# Patient Record
Sex: Female | Born: 1980 | Race: White | Hispanic: No | Smoking: Never smoker
Health system: Southern US, Community
[De-identification: ages and names within clinical notes are randomized; demographics above are authoritative.]

## PROBLEM LIST (undated history)

## (undated) HISTORY — PX: ABDOMINAL HYSTERECTOMY: SHX81

---

## 2006-03-03 ENCOUNTER — Emergency Department: Payer: Self-pay | Admitting: Internal Medicine

## 2006-03-13 ENCOUNTER — Other Ambulatory Visit: Payer: Self-pay

## 2006-03-13 ENCOUNTER — Emergency Department: Payer: Self-pay | Admitting: Emergency Medicine

## 2006-04-23 ENCOUNTER — Ambulatory Visit: Payer: Self-pay | Admitting: Gastroenterology

## 2006-09-09 ENCOUNTER — Observation Stay: Payer: Self-pay | Admitting: Obstetrics and Gynecology

## 2006-10-16 ENCOUNTER — Observation Stay: Payer: Self-pay | Admitting: Obstetrics and Gynecology

## 2006-11-22 ENCOUNTER — Observation Stay: Payer: Self-pay | Admitting: Obstetrics and Gynecology

## 2006-12-07 ENCOUNTER — Other Ambulatory Visit: Payer: Self-pay

## 2006-12-07 ENCOUNTER — Emergency Department: Payer: Self-pay | Admitting: Emergency Medicine

## 2006-12-07 ENCOUNTER — Observation Stay: Payer: Self-pay

## 2006-12-21 ENCOUNTER — Observation Stay: Payer: Self-pay | Admitting: Obstetrics and Gynecology

## 2006-12-23 ENCOUNTER — Ambulatory Visit: Payer: Self-pay | Admitting: Obstetrics and Gynecology

## 2006-12-24 ENCOUNTER — Inpatient Hospital Stay: Payer: Self-pay | Admitting: Obstetrics and Gynecology

## 2010-06-01 ENCOUNTER — Observation Stay: Payer: Self-pay | Admitting: Obstetrics and Gynecology

## 2010-06-22 ENCOUNTER — Inpatient Hospital Stay: Payer: Self-pay

## 2010-07-21 ENCOUNTER — Emergency Department: Payer: Self-pay | Admitting: Internal Medicine

## 2016-05-08 ENCOUNTER — Other Ambulatory Visit: Payer: Self-pay | Admitting: Medical Oncology

## 2016-05-08 ENCOUNTER — Ambulatory Visit: Admission: RE | Admit: 2016-05-08 | Payer: 59 | Source: Ambulatory Visit

## 2016-05-08 DIAGNOSIS — R1031 Right lower quadrant pain: Secondary | ICD-10-CM

## 2017-03-18 ENCOUNTER — Other Ambulatory Visit: Payer: Self-pay | Admitting: Family Medicine

## 2017-06-19 ENCOUNTER — Other Ambulatory Visit: Payer: Self-pay | Admitting: Pediatrics

## 2017-06-19 DIAGNOSIS — K5900 Constipation, unspecified: Secondary | ICD-10-CM

## 2017-06-19 DIAGNOSIS — R140 Abdominal distension (gaseous): Secondary | ICD-10-CM

## 2017-06-20 ENCOUNTER — Ambulatory Visit
Admission: RE | Admit: 2017-06-20 | Discharge: 2017-06-20 | Disposition: A | Discharge status: Home / Self Care | Payer: Medicaid Other | Source: Ambulatory Visit | Attending: Pediatrics | Admitting: Pediatrics

## 2017-06-20 DIAGNOSIS — R140 Abdominal distension (gaseous): Secondary | ICD-10-CM | POA: Diagnosis not present

## 2017-06-20 DIAGNOSIS — Z90721 Acquired absence of ovaries, unilateral: Secondary | ICD-10-CM | POA: Diagnosis not present

## 2017-06-20 DIAGNOSIS — K5900 Constipation, unspecified: Secondary | ICD-10-CM | POA: Insufficient documentation

## 2017-06-20 DIAGNOSIS — Z90710 Acquired absence of both cervix and uterus: Secondary | ICD-10-CM | POA: Diagnosis not present

## 2017-06-26 ENCOUNTER — Other Ambulatory Visit: Payer: Self-pay | Admitting: Obstetrics and Gynecology

## 2017-06-26 DIAGNOSIS — R140 Abdominal distension (gaseous): Secondary | ICD-10-CM

## 2017-06-26 DIAGNOSIS — Z90710 Acquired absence of both cervix and uterus: Secondary | ICD-10-CM

## 2017-07-01 ENCOUNTER — Other Ambulatory Visit: Payer: Self-pay | Admitting: Gastroenterology

## 2017-07-01 DIAGNOSIS — R1084 Generalized abdominal pain: Secondary | ICD-10-CM

## 2017-07-04 ENCOUNTER — Ambulatory Visit
Admission: RE | Admit: 2017-07-04 | Discharge: 2017-07-04 | Disposition: A | Discharge status: Home / Self Care | Payer: Medicaid Other | Source: Ambulatory Visit | Attending: Gastroenterology | Admitting: Gastroenterology

## 2017-07-04 DIAGNOSIS — R1084 Generalized abdominal pain: Secondary | ICD-10-CM

## 2017-07-04 DIAGNOSIS — N200 Calculus of kidney: Secondary | ICD-10-CM | POA: Insufficient documentation

## 2017-07-04 MED ORDER — IOPAMIDOL (ISOVUE-370) INJECTION 76%
100.0000 mL | Freq: Once | INTRAVENOUS | Status: AC | PRN
  Administered 2017-07-04: 100 mL via INTRAVENOUS

## 2017-07-07 ENCOUNTER — Ambulatory Visit: Payer: Medicaid Other

## 2018-12-24 ENCOUNTER — Other Ambulatory Visit: Payer: Self-pay | Admitting: Pediatrics

## 2018-12-24 ENCOUNTER — Other Ambulatory Visit: Payer: Self-pay | Admitting: Medical Oncology

## 2018-12-24 DIAGNOSIS — Z1231 Encounter for screening mammogram for malignant neoplasm of breast: Secondary | ICD-10-CM

## 2018-12-30 ENCOUNTER — Inpatient Hospital Stay: Admission: RE | Admit: 2018-12-30 | Payer: Medicaid Other | Source: Ambulatory Visit

## 2019-01-13 ENCOUNTER — Other Ambulatory Visit: Payer: Self-pay

## 2019-01-13 DIAGNOSIS — Z20822 Contact with and (suspected) exposure to covid-19: Secondary | ICD-10-CM

## 2019-01-15 LAB — NOVEL CORONAVIRUS, NAA: SARS-CoV-2, NAA: NOT DETECTED

## 2019-03-15 ENCOUNTER — Inpatient Hospital Stay: Admission: RE | Admit: 2019-03-15 | Payer: Medicaid Other | Source: Ambulatory Visit

## 2019-05-08 ENCOUNTER — Ambulatory Visit: Payer: Medicaid Other | Attending: Internal Medicine

## 2019-05-08 DIAGNOSIS — Z23 Encounter for immunization: Secondary | ICD-10-CM

## 2019-05-08 NOTE — Progress Notes (Signed)
   Covid-19 Vaccination Clinic  Name:  Alicia Franklin    MRN: [id number] DOB: June 15, 1980  05/08/2019  [name] was observed post Covid-19 immunization for 15 minutes without incident. She was provided with Vaccine Information Sheet and instruction to access the V-Safe system.   [name] was instructed to call 911 with any severe reactions post vaccine: Marland Kitchen Difficulty breathing  . Swelling of face and throat  . A fast heartbeat  . A bad rash all over body  . Dizziness and weakness   Immunizations Administered    Name Date Dose VIS Date Route   Pfizer COVID-19 Vaccine 05/08/2019  8:56 AM 0.3 mL 02/05/2019 Intramuscular   Manufacturer: [employer], [employer]   Lot: [id number]   NDC: [address]-4

## 2019-05-27 IMAGING — CT CT ABD-PELV W/ CM
1 of 2 series · 15 of 32 positions shown, 19 images · IV contrast (APPLIED)
Comparison: None.

CLINICAL DATA: Diffuse abdominal pain. Bloating and fullness for 2
months after eating

EXAM:
CT ABDOMEN AND PELVIS WITH CONTRAST
TECHNIQUE: Multidetector CT imaging of the abdomen and pelvis was performed
using the standard protocol following bolus administration of
intravenous contrast.
CONTRAST:  100mL [id number] IOPAMIDOL ([id number]-[id number]) INJECTION 76%

[Series 2: axial st · axial · 0.64mm/px · z∈[-674,-258]mm · 15 of 91 slices shown, 19 images]
[im 4/91  soft-tissue]
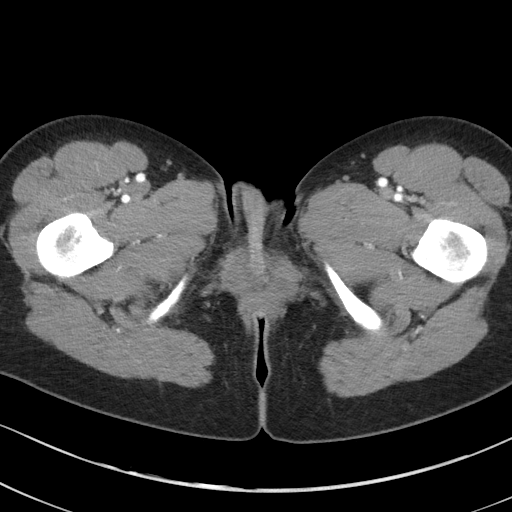
[im 4/91  bone]
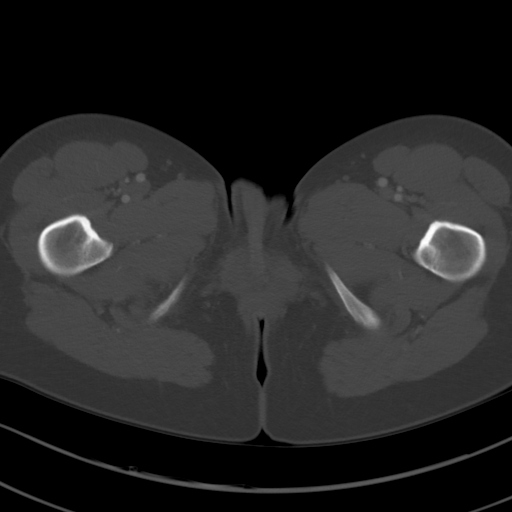
[im 12/91  soft-tissue]
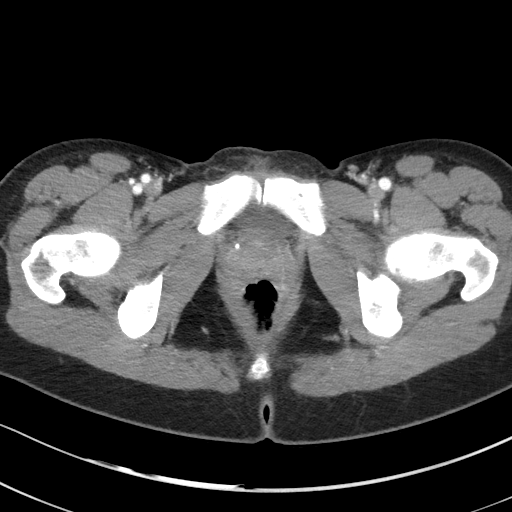
[im 19/91  soft-tissue]
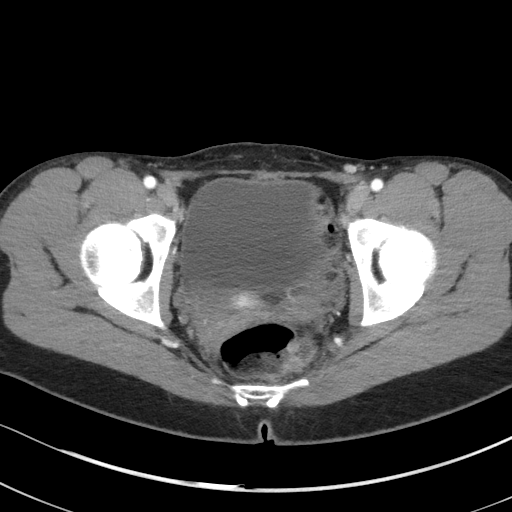
[im 27/91  soft-tissue]
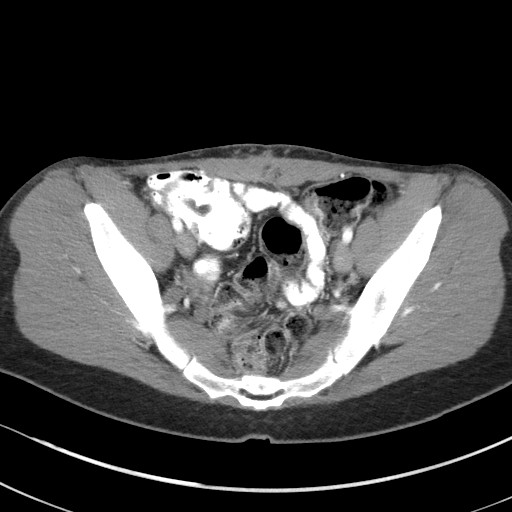
[im 31/91  soft-tissue]
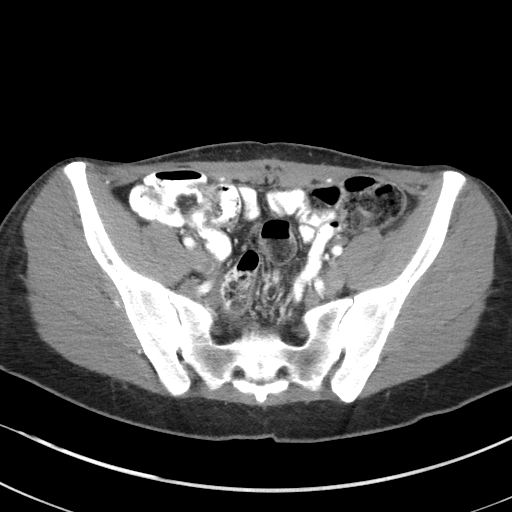
[im 38/91  soft-tissue]
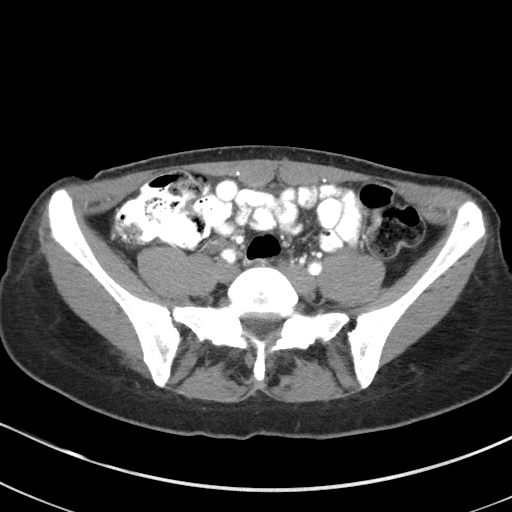
[im 46/91  soft-tissue]
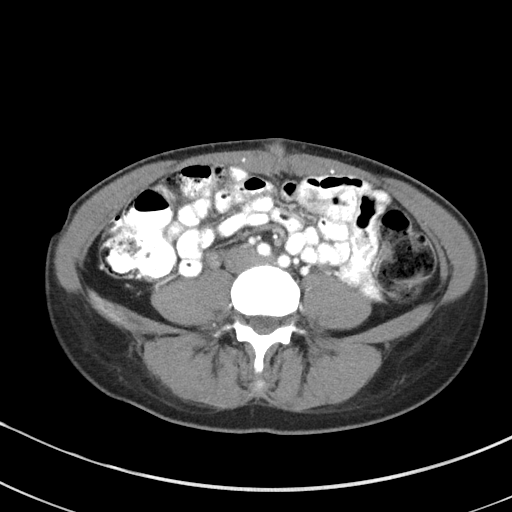
[im 53/91  soft-tissue]
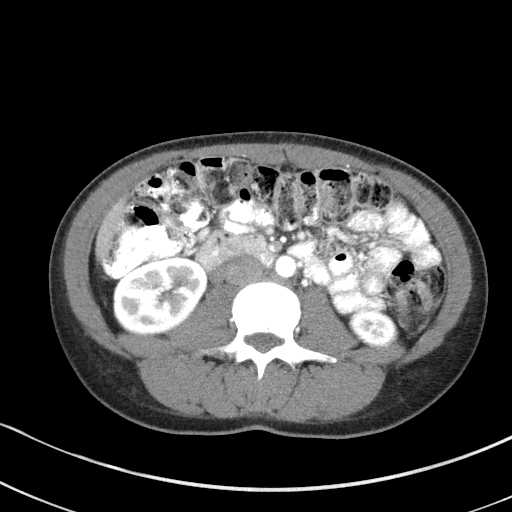
[im 61/91  soft-tissue]
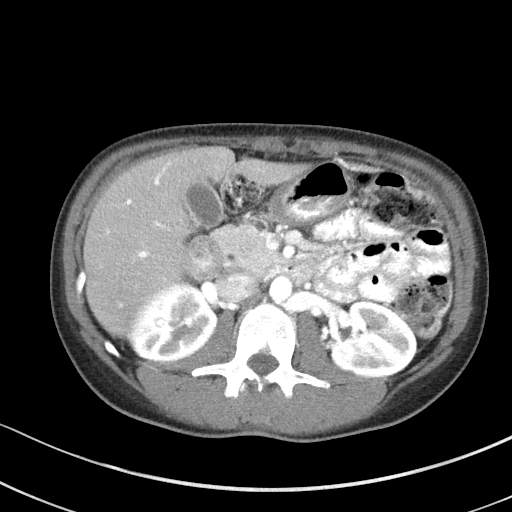
[im 61/91  bone]
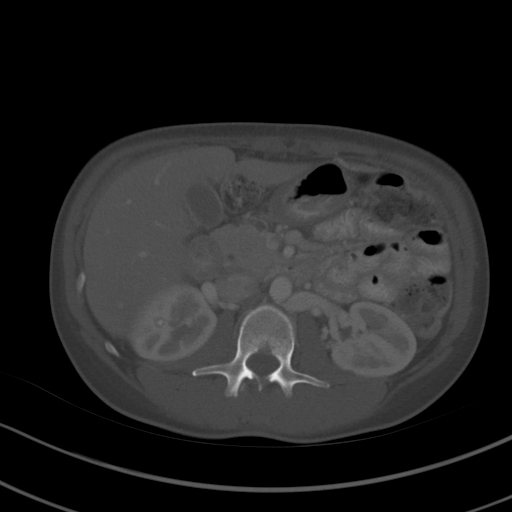
[im 64/91  soft-tissue]
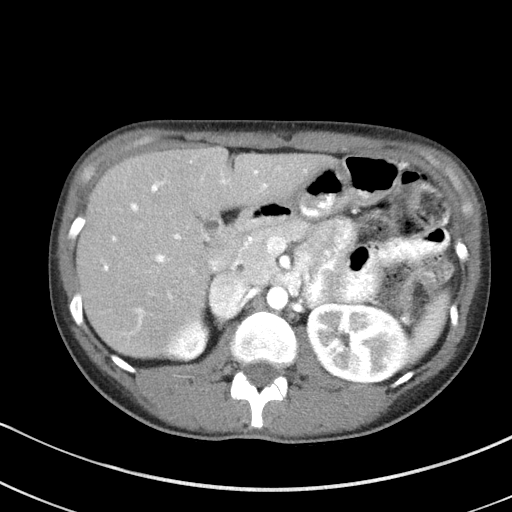
[im 72/91  soft-tissue]
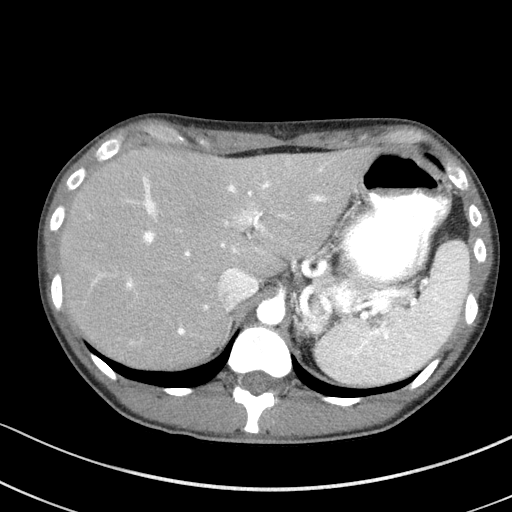
[im 76/91  lung]
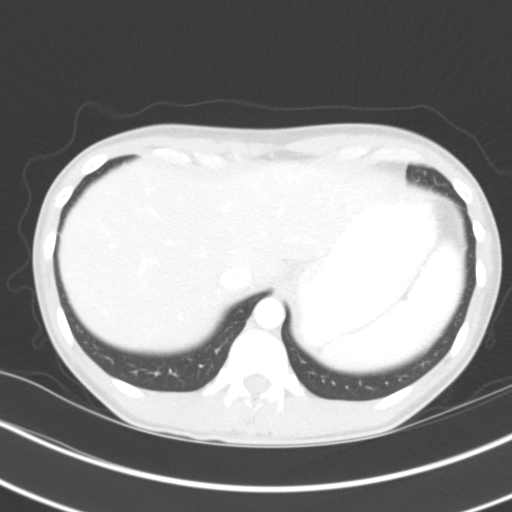
[im 79/91  soft-tissue]
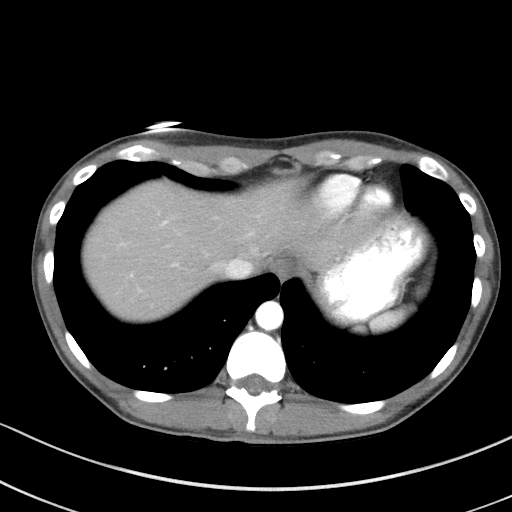
[im 79/91  lung]
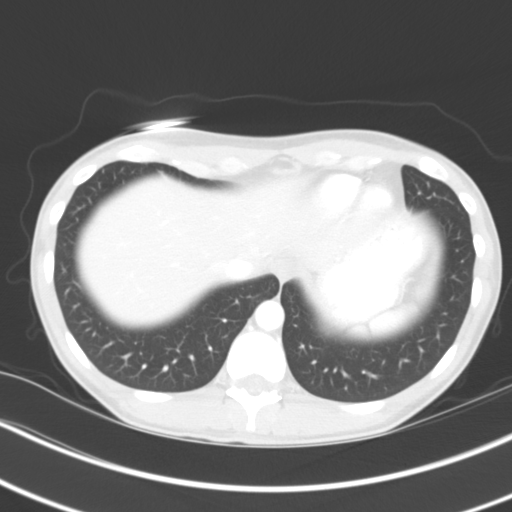
[im 83/91  lung]
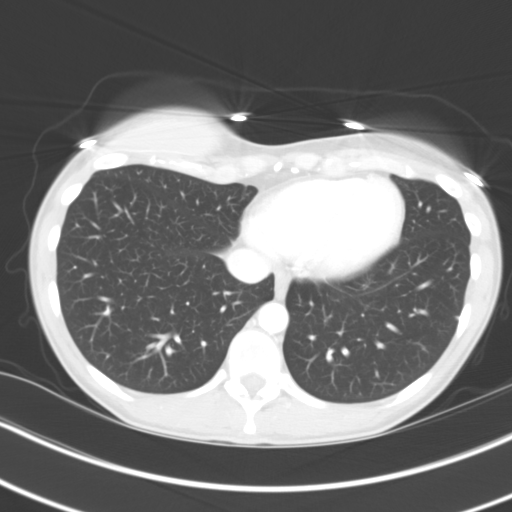
[im 87/91  soft-tissue]
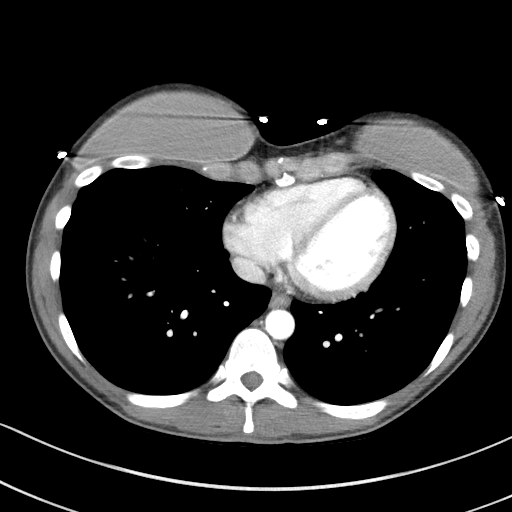
[im 87/91  lung]
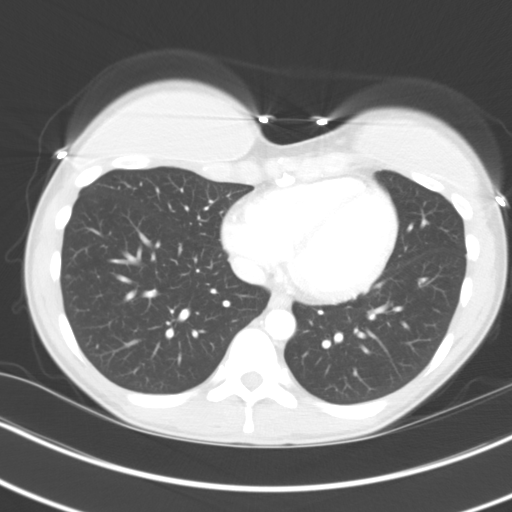

[15 of 32 positions shown; findings below may reference images not displayed]

FINDINGS: Lower chest:  Breast implants that are unremarkable where covered.

Hepatobiliary: 6 mm low-density in the central right liver and 5 mm
low-density below the right diaphragm are too small for
densitometry. An area of hypoperfusion in the left lobe of the liver
in 2225 is only subtly seen today, suspect this was mainly perfusion
anomaly with possible small underlying hemangioma.No evidence of
biliary obstruction or stone.

Pancreas: Unremarkable.

Spleen: Unremarkable.

Adrenals/Urinary Tract: Negative adrenals. Two right renal
calcifications measuring up to 4 mm at the upper pole. Two small
right interpolar cysts that appear simple, measuring up to 8 mm.
Unremarkable bladder.

Stomach/Bowel: No obstruction or inflammation is seen. Oral contrast
reaches the hepatic flexure. Stool seen throughout the colon without
rectal impaction. No appendicitis.

Vascular/Lymphatic: No significant vascular abnormality. Retroaortic
left renal vein. No mass or adenopathy.

Reproductive:Hysterectomy.  Dominant follicle on the right.

Other: No ascites or pneumoperitoneum.

Musculoskeletal: Negative
IMPRESSION: 1. No specific explanation for symptoms.
2. Formed stool is seen throughout the colon.
3. At least 2 right renal calculi measuring up to 4 mm.

## 2019-06-01 ENCOUNTER — Ambulatory Visit: Payer: Medicaid Other

## 2019-06-08 ENCOUNTER — Ambulatory Visit: Payer: Medicaid Other

## 2019-07-24 ENCOUNTER — Other Ambulatory Visit: Payer: Self-pay

## 2019-07-24 ENCOUNTER — Ambulatory Visit: Payer: Medicaid Other | Attending: Internal Medicine

## 2019-07-24 DIAGNOSIS — Z23 Encounter for immunization: Secondary | ICD-10-CM

## 2019-07-24 NOTE — Progress Notes (Signed)
   Covid-19 Vaccination Clinic  Name:  Alicia Franklin    MRN: [id number] DOB: 28-Mar-1980  07/24/2019  [name] was observed post Covid-19 immunization for 15 minutes without incident. She was provided with Vaccine Information Sheet and instruction to access the V-Safe system.   [name] was instructed to call 911 with any severe reactions post vaccine: Marland Kitchen Difficulty breathing  . Swelling of face and throat  . A fast heartbeat  . A bad rash all over body  . Dizziness and weakness   Immunizations Administered    Name Date Dose VIS Date Route   Pfizer COVID-19 Vaccine 07/24/2019 11:48 AM 0.3 mL 04/21/2018 Intramuscular   Manufacturer: [employer], [employer]   Lot: [id number]   NDC: [address]-0

## 2019-07-24 NOTE — Progress Notes (Signed)
   Covid-19 Vaccination Clinic  Name:  Alicia Franklin    MRN: [id number] DOB: 1980-06-30  07/24/2019  Alicia Franklin was observed post Covid-19 immunization for 15 minutes without incident. She was provided with Vaccine Information Sheet and instruction to access the V-Safe system. Patient reports that she is delayed in getting 2nd vaccine due to getting Covid after receiving the 1 st vaccine. She saw her PCP this week who advised her to proceed with getting the 2 nd vaccine. She received her 1 st dose on 05/08/19.  Alicia Franklin was instructed to call 911 with any severe reactions post vaccine: Marland Kitchen Difficulty breathing  . Swelling of face and throat  . A fast heartbeat  . A bad rash all over body  . Dizziness and weakness   Immunizations Administered    Name Date Dose VIS Date Route   Pfizer COVID-19 Vaccine 07/24/2019 11:48 AM 0.3 mL 04/21/2018 Intramuscular   Manufacturer: [employer], [employer]   Lot: [id number]   NDC: [address]-3

## 2019-08-17 ENCOUNTER — Other Ambulatory Visit: Payer: Self-pay | Admitting: Gastroenterology

## 2019-08-17 DIAGNOSIS — R140 Abdominal distension (gaseous): Secondary | ICD-10-CM

## 2019-08-17 DIAGNOSIS — R1084 Generalized abdominal pain: Secondary | ICD-10-CM

## 2019-08-24 ENCOUNTER — Ambulatory Visit: Payer: Medicaid Other

## 2019-08-26 ENCOUNTER — Ambulatory Visit
Admission: RE | Admit: 2019-08-26 | Discharge: 2019-08-26 | Disposition: A | Discharge status: Home / Self Care | Payer: BC Managed Care – PPO | Source: Ambulatory Visit | Attending: Gastroenterology | Admitting: Gastroenterology

## 2019-08-26 ENCOUNTER — Other Ambulatory Visit: Payer: Self-pay

## 2019-08-26 DIAGNOSIS — R140 Abdominal distension (gaseous): Secondary | ICD-10-CM | POA: Diagnosis present

## 2019-08-26 DIAGNOSIS — R1084 Generalized abdominal pain: Secondary | ICD-10-CM

## 2019-08-26 MED ORDER — IOHEXOL 300 MG/ML  SOLN
100.0000 mL | Freq: Once | INTRAMUSCULAR | Status: AC | PRN
  Administered 2019-08-26: 100 mL via INTRAVENOUS

## 2019-11-08 ENCOUNTER — Ambulatory Visit
Admission: EM | Admit: 2019-11-08 | Discharge: 2019-11-08 | Disposition: A | Discharge status: Home / Self Care | Payer: Medicaid Other | Attending: Urgent Care | Admitting: Urgent Care

## 2019-11-08 DIAGNOSIS — J069 Acute upper respiratory infection, unspecified: Secondary | ICD-10-CM | POA: Diagnosis present

## 2019-11-08 DIAGNOSIS — R070 Pain in throat: Secondary | ICD-10-CM | POA: Diagnosis present

## 2019-11-08 DIAGNOSIS — R0981 Nasal congestion: Secondary | ICD-10-CM | POA: Insufficient documentation

## 2019-11-08 LAB — POCT RAPID STREP A (OFFICE): Rapid Strep A Screen: NEGATIVE

## 2019-11-08 MED ORDER — CETIRIZINE HCL 10 MG PO TABS: 10.0000 mg | ORAL_TABLET | Freq: Every day | ORAL | 0 refills | Status: DC

## 2019-11-08 MED ORDER — PSEUDOEPHEDRINE HCL 30 MG PO TABS: 30.0000 mg | ORAL_TABLET | Freq: Three times a day (TID) | ORAL | 0 refills | Status: DC | PRN

## 2019-11-08 NOTE — Discharge Instructions (Signed)

## 2019-11-08 NOTE — ED Provider Notes (Signed)
  Vivien Rossetti   MRN: [id number] DOB: 06-25-1980  Subjective:   Alicia Franklin is a 39 y.o. female presenting for 3-day history acute onset sinus congestion, throat pain, general malaise.  Patient is fully vaccinated.  She is required at her employer/school to be tested for COVID-19.  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  No current facility-administered medications for this encounter. No current outpatient medications on file.   No Known Allergies  History reviewed. No pertinent past medical history.   Past Surgical History:  Procedure Laterality Date  . ABDOMINAL HYSTERECTOMY      History reviewed. No pertinent family history.  Social History   Tobacco Use  . Smoking status: Never Smoker  . Smokeless tobacco: Never Used  Vaping Use  . Vaping Use: Never used  Substance Use Topics  . Alcohol use: Not Currently  . Drug use: Not on file    ROS   Objective:   Vitals: BP 115/76   Pulse 62   Temp 98.9 F (37.2 C)   Resp 16   SpO2 94%   Physical Exam Constitutional:      General: She is not in acute distress.    Appearance: Normal appearance. She is well-developed. She is not ill-appearing, toxic-appearing or diaphoretic.  HENT:     Head: Normocephalic and atraumatic.     Nose: Nose normal.     Mouth/Throat:     Mouth: Mucous membranes are moist.     Pharynx: No pharyngeal swelling, oropharyngeal exudate, posterior oropharyngeal erythema or uvula swelling.     Comments: Postnasal drainage overlying pharynx. Eyes:     Extraocular Movements: Extraocular movements intact.     Pupils: Pupils are equal, round, and reactive to light.  Cardiovascular:     Rate and Rhythm: Normal rate and regular rhythm.     Pulses: Normal pulses.     Heart sounds: Normal heart sounds. No murmur heard.  No friction rub. No gallop.   Pulmonary:     Effort: Pulmonary effort is normal. No respiratory distress.      Breath sounds: Normal breath sounds. No stridor. No wheezing, rhonchi or rales.  Skin:    General: Skin is warm and dry.     Findings: No rash.  Neurological:     Mental Status: She is alert and oriented to person, place, and time.  Psychiatric:        Mood and Affect: Mood normal.        Behavior: Behavior normal.        Thought Content: Thought content normal.     Rapid strep negative on visual inspection.  Assessment and Plan :   PDMP not reviewed this encounter.  1. Viral URI   2. Throat pain   3. Sinus congestion     Will manage for viral illness such as viral URI, viral syndrome, viral rhinitis, COVID-19. Counseled patient on nature of COVID-19 including modes of transmission, diagnostic testing, management and supportive care.  Offered scripts for symptomatic relief. COVID 19 testing is pending. Counseled patient on potential for adverse effects with medications prescribed/recommended today, ER and return-to-clinic precautions discussed, patient verbalized understanding.     Wallis Bamberg, PA-C 11/08/19 1240

## 2019-11-08 NOTE — ED Triage Notes (Signed)
Patient complains of sore throat since Saturday and fever since early this morning. Denies other symptoms. 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

## 2019-11-10 LAB — SARS-COV-2, NAA 2 DAY TAT

## 2019-11-10 LAB — NOVEL CORONAVIRUS, NAA: SARS-CoV-2, NAA: NOT DETECTED

## 2019-11-11 LAB — CULTURE, GROUP A STREP (THRC)

## 2020-02-26 ENCOUNTER — Ambulatory Visit
Admission: EM | Admit: 2020-02-26 | Discharge: 2020-02-26 | Disposition: A | Discharge status: Home / Self Care | Payer: BC Managed Care – PPO | Attending: Family Medicine | Admitting: Family Medicine

## 2020-02-26 ENCOUNTER — Other Ambulatory Visit: Payer: Self-pay

## 2020-02-26 DIAGNOSIS — U071 COVID-19: Secondary | ICD-10-CM | POA: Diagnosis not present

## 2020-02-26 DIAGNOSIS — J40 Bronchitis, not specified as acute or chronic: Secondary | ICD-10-CM

## 2020-02-26 MED ORDER — PREDNISONE 50 MG PO TABS: ORAL_TABLET | ORAL | 0 refills | Status: DC

## 2020-02-26 MED ORDER — BENZONATATE 200 MG PO CAPS: 200.0000 mg | ORAL_CAPSULE | Freq: Three times a day (TID) | ORAL | 0 refills | Status: DC | PRN

## 2020-02-26 MED ORDER — DOXYCYCLINE HYCLATE 100 MG PO CAPS: 100.0000 mg | ORAL_CAPSULE | Freq: Two times a day (BID) | ORAL | 0 refills | Status: DC

## 2020-02-26 NOTE — Discharge Instructions (Signed)
Results available in 24 hours.  Medications as prescribed.  Take care  Dr. Adriana Simas

## 2020-02-26 NOTE — ED Triage Notes (Signed)
Patient states that she has been having a cough x 1 month. States that she was seen by [facility] weeks ago and was given cough syrup, fatigue and body aches. Patient states that symptoms have worsened since 12/20. States that she started coughing up mucus last week.

## 2020-02-27 LAB — SARS CORONAVIRUS 2 (TAT 6-24 HRS): SARS Coronavirus 2: POSITIVE — AB

## 2020-02-27 NOTE — ED Provider Notes (Signed)
[facility]    CSN: [id number] Arrival date & time: 02/26/20  1512      History   Chief Complaint Chief Complaint  Patient presents with  . Cough   HPI  40 year old female presents with cough.  Patient states that she initially began to get sick approximately 1 month ago.  At that time was having body aches, chills and had an exposure at work.  Patient was treated with cough medicine and supportive care.  Patient states that she seemed to improve but has never fully gotten better.  More recently, since 12/20 she has had significant cough which  has been productive of discolored sputum.  Now it is more dry cough.  She reports some associated discomfort from the cough.  No relieving factors.  Husband is also sick now.  No other complaints.  Past Surgical History:  Procedure Laterality Date  . ABDOMINAL HYSTERECTOMY     OB History   No obstetric history on file.    Home Medications    Prior to Admission medications   Medication Sig Start Date End Date Taking? Authorizing Provider  benzonatate (TESSALON) 200 MG capsule Take 1 capsule (200 mg total) by mouth 3 (three) times daily as needed for cough. 02/26/20  Yes Desere Gwin G, DO  doxycycline (VIBRAMYCIN) 100 MG capsule Take 1 capsule (100 mg total) by mouth 2 (two) times daily. 02/26/20  Yes Lendora Keys G, DO  predniSONE (DELTASONE) 50 MG tablet 1 tablet daily x 5 days 02/26/20  Yes Jevaeh Shams G, DO  cetirizine (ZYRTEC ALLERGY) 10 MG tablet Take 1 tablet (10 mg total) by mouth daily. 11/08/19 02/26/20  Wallis Bamberg, PA-C   Social History Social History   Tobacco Use  . Smoking status: Never Smoker  . Smokeless tobacco: Never Used  Vaping Use  . Vaping Use: Never used  Substance Use Topics  . Alcohol use: Not Currently     Allergies   Patient has no known allergies.   Review of Systems Review of Systems  Constitutional: Positive for fatigue.  Respiratory: Positive for cough.    Physical Exam Triage Vital  Signs ED Triage Vitals  Enc Vitals Group     BP 02/26/20 1545 106/68     Pulse Rate 02/26/20 1545 95     Resp 02/26/20 1545 18     Temp 02/26/20 1545 98.7 F (37.1 C)     Temp Source 02/26/20 1545 Oral     SpO2 02/26/20 1545 100 %     Weight 02/26/20 1544 130 lb (59 kg)     Height 02/26/20 1544 5\' 7"  (1.702 m)     Head Circumference --      Peak Flow --      Pain Score 02/26/20 1544 6     Pain Loc --      Pain Edu? --      Excl. in GC? --    Updated Vital Signs BP 106/68 (BP Location: Right Arm)   Pulse 95   Temp 98.7 F (37.1 C) (Oral)   Resp 18   Ht 5\' 7"  (1.702 m)   Wt 59 kg   SpO2 100%   BMI 20.36 kg/m   Visual Acuity Right Eye Distance:   Left Eye Distance:   Bilateral Distance:    Right Eye Near:   Left Eye Near:    Bilateral Near:     Physical Exam Vitals and nursing note reviewed.  Constitutional:      General: She  is not in acute distress.    Appearance: Normal appearance. She is not ill-appearing.  HENT:     Head: Normocephalic.  Eyes:     General:        Right eye: No discharge.        Left eye: No discharge.     Conjunctiva/sclera: Conjunctivae normal.  Cardiovascular:     Rate and Rhythm: Normal rate and regular rhythm.  Pulmonary:     Effort: Pulmonary effort is normal.     Breath sounds: Normal breath sounds. No wheezing, rhonchi or rales.  Neurological:     Mental Status: She is alert.  Psychiatric:        Mood and Affect: Mood normal.        Behavior: Behavior normal.    UC Treatments / Results  Labs (all labs ordered are listed, but only abnormal results are displayed) Labs Reviewed  SARS CORONAVIRUS 2 (TAT 6-24 HRS) - Abnormal; Notable for the following components:      Result Value   SARS Coronavirus 2 POSITIVE (*)    All other components within normal limits    EKG   Radiology No results found.  Procedures Procedures (including critical care time)  Medications Ordered in UC Medications - No data to  display  Initial Impression / Assessment and Plan / UC Course  I have reviewed the triage vital signs and the nursing notes.  Pertinent labs & imaging results that were available during my care of the patient were reviewed by me and considered in my medical decision making (see chart for details).    40 year old female presents with respiratory symptoms.  Test has returned positive for COVID-19.  Patient has had ongoing symptoms for nearly a month.  She has been more troubled by her symptoms since 12/20.  Patient is out of the quarantine window and is overall well-appearing.  She is continuing to have cough which is productive.  Patient was placed on doxycycline and prednisone to cover for potential superimposed bacterial infection.  Tessalon Perles for cough.  Final Clinical Impressions(s) / UC Diagnoses   Final diagnoses:  COVID     Discharge Instructions     Results available in 24 hours.  Medications as prescribed.  Take care  Dr. Adriana Simas     ED Prescriptions    Medication Sig Dispense Auth. Provider   doxycycline (VIBRAMYCIN) 100 MG capsule Take 1 capsule (100 mg total) by mouth 2 (two) times daily. 14 capsule Alyzabeth Pontillo G, DO   predniSONE (DELTASONE) 50 MG tablet 1 tablet daily x 5 days 5 tablet Marks Scalera G, DO   benzonatate (TESSALON) 200 MG capsule Take 1 capsule (200 mg total) by mouth 3 (three) times daily as needed for cough. 30 capsule Tommie Sams, DO     PDMP not reviewed this encounter.   Everlene Other [place], [place] 02/27/20 [phone]

## 2020-06-09 ENCOUNTER — Encounter: Payer: Self-pay | Admitting: Emergency Medicine

## 2020-06-09 ENCOUNTER — Other Ambulatory Visit: Payer: Self-pay

## 2020-06-09 ENCOUNTER — Ambulatory Visit
Admission: EM | Admit: 2020-06-09 | Discharge: 2020-06-09 | Disposition: A | Discharge status: Home / Self Care | Payer: BC Managed Care – PPO | Attending: Emergency Medicine | Admitting: Emergency Medicine

## 2020-06-09 DIAGNOSIS — R350 Frequency of micturition: Secondary | ICD-10-CM | POA: Insufficient documentation

## 2020-06-09 DIAGNOSIS — N898 Other specified noninflammatory disorders of vagina: Secondary | ICD-10-CM | POA: Diagnosis present

## 2020-06-09 LAB — POCT URINALYSIS DIP (MANUAL ENTRY)
Bilirubin, UA: NEGATIVE
Blood, UA: NEGATIVE
Glucose, UA: NEGATIVE mg/dL
Ketones, POC UA: NEGATIVE mg/dL
Leukocytes, UA: NEGATIVE
Nitrite, UA: NEGATIVE
Protein Ur, POC: NEGATIVE mg/dL
Spec Grav, UA: >=1.03 — AB (ref 1.010–1.025)
Urobilinogen, UA: 1 E.U./dL
pH, UA: 6.5 (ref 5.0–8.0)

## 2020-06-09 MED ORDER — METRONIDAZOLE 500 MG PO TABS: 500.0000 mg | ORAL_TABLET | Freq: Two times a day (BID) | ORAL | 0 refills | Status: DC

## 2020-06-09 NOTE — Discharge Instructions (Signed)
Take metronidazole twice a day for 7 days.    Your vaginal tests are pending.  If your test results are positive, we will call you.  You and your sexual partner(s) may require treatment at that time.  Do not have sexual activity for at least 7 days.    Follow up with your primary care provider if your symptoms are not improving.    

## 2020-06-09 NOTE — ED Provider Notes (Signed)
Alicia Franklin    CSN: [id number] Arrival date & time: 06/09/20  1036      History   Chief Complaint Chief Complaint  Patient presents with  . Vaginitis  . Urinary Frequency    HPI Alicia Franklin is a 40 y.o. female.   Patient presents with malodorous vaginal discharge x4 days.  She also reports urinary frequency.  She denies fever, chills, abdominal pain, flank pain, pelvic pain, or other symptoms.  No treatments attempted at home.  Her medical history includes hysterectomy.  The history is provided by the patient and medical records.    History reviewed. No pertinent past medical history.  There are no problems to display for this patient.   Past Surgical History:  Procedure Laterality Date  . ABDOMINAL HYSTERECTOMY      OB History   No obstetric history on file.      Home Medications    Prior to Admission medications   Medication Sig Start Date End Date Taking? Authorizing Provider  LINZESS 290 MCG CAPS capsule Take 290 mcg by mouth daily. 05/06/20  Yes [provider]  metroNIDAZOLE (FLAGYL) 500 MG tablet Take 1 tablet (500 mg total) by mouth 2 (two) times daily. 06/09/20  Yes Mickie Bail, NP  benzonatate (TESSALON) 200 MG capsule Take 1 capsule (200 mg total) by mouth 3 (three) times daily as needed for cough. 02/26/20   Tommie Sams, DO  doxycycline (VIBRAMYCIN) 100 MG capsule Take 1 capsule (100 mg total) by mouth 2 (two) times daily. 02/26/20   Tommie Sams, DO  predniSONE (DELTASONE) 50 MG tablet 1 tablet daily x 5 days 02/26/20   Tommie Sams, DO  cetirizine (ZYRTEC ALLERGY) 10 MG tablet Take 1 tablet (10 mg total) by mouth daily. 11/08/19 02/26/20  Wallis Bamberg, PA-C    Family History History reviewed. No pertinent family history.  Social History Social History   Tobacco Use  . Smoking status: Never Smoker  . Smokeless tobacco: Never Used  Vaping Use  . Vaping Use: Never used  Substance Use Topics  . Alcohol use: Not Currently      Allergies   Patient has no known allergies.   Review of Systems Review of Systems  Constitutional: Negative for chills and fever.  HENT: Negative for ear pain and sore throat.   Eyes: Negative for pain and visual disturbance.  Respiratory: Negative for cough and shortness of breath.   Cardiovascular: Negative for chest pain and palpitations.  Gastrointestinal: Negative for abdominal pain and vomiting.  Genitourinary: Positive for frequency and vaginal discharge. Negative for dysuria and hematuria.  Musculoskeletal: Negative for arthralgias and back pain.  Skin: Negative for color change and rash.  Neurological: Negative for seizures and syncope.  All other systems reviewed and are negative.    Physical Exam Triage Vital Signs ED Triage Vitals  Enc Vitals Group     BP      Pulse      Resp      Temp      Temp src      SpO2      Weight      Height      Head Circumference      Peak Flow      Pain Score      Pain Loc      Pain Edu?      Excl. in GC?    No data found.  Updated Vital Signs BP 111/74 (BP Location:  Left Arm)   Pulse 71   Temp 99 F (37.2 C) (Oral)   Resp 18   SpO2 98%   Visual Acuity Right Eye Distance:   Left Eye Distance:   Bilateral Distance:    Right Eye Near:   Left Eye Near:    Bilateral Near:     Physical Exam Vitals and nursing note reviewed.  Constitutional:      General: She is not in acute distress.    Appearance: She is well-developed. She is not ill-appearing.  HENT:     Head: Normocephalic and atraumatic.     Mouth/Throat:     Mouth: Mucous membranes are moist.  Eyes:     Conjunctiva/sclera: Conjunctivae normal.  Cardiovascular:     Rate and Rhythm: Normal rate and regular rhythm.     Heart sounds: Normal heart sounds.  Pulmonary:     Effort: Pulmonary effort is normal. No respiratory distress.     Breath sounds: Normal breath sounds.  Abdominal:     Palpations: Abdomen is soft.     Tenderness: There is no  abdominal tenderness. There is no right CVA tenderness, left CVA tenderness, guarding or rebound.  Musculoskeletal:     Cervical back: Neck supple.  Skin:    General: Skin is warm and dry.  Neurological:     General: No focal deficit present.     Mental Status: She is alert and oriented to person, place, and time.     Gait: Gait normal.  Psychiatric:        Mood and Affect: Mood normal.        Behavior: Behavior normal.      UC Treatments / Results  Labs (all labs ordered are listed, but only abnormal results are displayed) Labs Reviewed  POCT URINALYSIS DIP (MANUAL ENTRY) - Abnormal; Notable for the following components:      Result Value   Spec Grav, UA >=1.030 (*)    All other components within normal limits  CERVICOVAGINAL ANCILLARY ONLY    EKG   Radiology No results found.  Procedures Procedures (including critical care time)  Medications Ordered in UC Medications - No data to display  Initial Impression / Assessment and Plan / UC Course  I have reviewed the triage vital signs and the nursing notes.  Pertinent labs & imaging results that were available during my care of the patient were reviewed by me and considered in my medical decision making (see chart for details).   Vaginal discharge and urinary frequency.  No indication of UTI.  Instructed patient to increase her water intake.  Patient obtained vaginal self swab for testing.  Treating with metronidazole.  Discussed with patient that she may require additional treatment if her test results are positive.  Discussed that her sexual partner may also require treatment.  Instructed her to abstain from sexual activity for at least 7 days.  Instructed her to follow-up with her PCP or OB/GYN if her symptoms are not improving.  Patient agrees to plan of care.   Final Clinical Impressions(s) / UC Diagnoses   Final diagnoses:  Vaginal discharge  Urinary frequency     Discharge Instructions     Take  metronidazole twice a day for 7 days.    Your vaginal tests are pending.  If your test results are positive, we will call you.  You and your sexual partner(s) may require treatment at that time.  Do not have sexual activity for at least 7 days.  Follow up with your primary care provider if your symptoms are not improving.          ED Prescriptions    Medication Sig Dispense Auth. Provider   metroNIDAZOLE (FLAGYL) 500 MG tablet Take 1 tablet (500 mg total) by mouth 2 (two) times daily. 14 tablet Mickie Bail, NP     PDMP not reviewed this encounter.   Mickie Bail, NP 06/09/20 1105

## 2020-06-09 NOTE — ED Triage Notes (Signed)
Patient c/o vaginal discharge, vaginal dryness, and urinary frequency x 4 days.   Patient denies ABD pain, vaginal discharge, hematuria, foul urinary odor, or back pain.   Patient endorses a previous episode of symptoms when diagnosed with bacteria vaginosis.   Patient hasn't taken any medications for symptoms.

## 2020-06-12 LAB — CERVICOVAGINAL ANCILLARY ONLY
Bacterial Vaginitis (gardnerella): POSITIVE — AB
Candida Glabrata: NEGATIVE
Candida Vaginitis: NEGATIVE
Chlamydia: NEGATIVE
Comment: NEGATIVE
Comment: NEGATIVE
Comment: NEGATIVE
Comment: NEGATIVE
Comment: NEGATIVE
Comment: NORMAL
Neisseria Gonorrhea: NEGATIVE
Trichomonas: NEGATIVE

## 2021-01-02 ENCOUNTER — Inpatient Hospital Stay
Admission: RE | Admit: 2021-01-02 | Discharge: 2021-01-02 | Disposition: A | Discharge status: Home / Self Care | Payer: Self-pay | Source: Ambulatory Visit | Attending: *Deleted | Admitting: *Deleted

## 2021-01-02 ENCOUNTER — Other Ambulatory Visit: Payer: Self-pay | Admitting: *Deleted

## 2021-01-02 DIAGNOSIS — Z1231 Encounter for screening mammogram for malignant neoplasm of breast: Secondary | ICD-10-CM

## 2021-01-05 ENCOUNTER — Other Ambulatory Visit: Payer: Self-pay | Admitting: Family Medicine

## 2021-01-09 ENCOUNTER — Other Ambulatory Visit: Payer: Self-pay | Admitting: Family Medicine

## 2021-01-09 DIAGNOSIS — N6459 Other signs and symptoms in breast: Secondary | ICD-10-CM

## 2021-01-09 DIAGNOSIS — Q839 Congenital malformation of breast, unspecified: Secondary | ICD-10-CM

## 2021-05-15 ENCOUNTER — Ambulatory Visit
Admission: EM | Admit: 2021-05-15 | Discharge: 2021-05-15 | Disposition: A | Discharge status: Home / Self Care | Payer: BC Managed Care – PPO | Attending: Emergency Medicine | Admitting: Emergency Medicine

## 2021-05-15 ENCOUNTER — Other Ambulatory Visit: Payer: Self-pay

## 2021-05-15 DIAGNOSIS — R52 Pain, unspecified: Secondary | ICD-10-CM | POA: Diagnosis not present

## 2021-05-15 DIAGNOSIS — R059 Cough, unspecified: Secondary | ICD-10-CM | POA: Insufficient documentation

## 2021-05-15 DIAGNOSIS — J029 Acute pharyngitis, unspecified: Secondary | ICD-10-CM | POA: Insufficient documentation

## 2021-05-15 DIAGNOSIS — Z20822 Contact with and (suspected) exposure to covid-19: Secondary | ICD-10-CM | POA: Insufficient documentation

## 2021-05-15 DIAGNOSIS — J069 Acute upper respiratory infection, unspecified: Secondary | ICD-10-CM | POA: Diagnosis not present

## 2021-05-15 DIAGNOSIS — R509 Fever, unspecified: Secondary | ICD-10-CM | POA: Insufficient documentation

## 2021-05-15 LAB — RAPID INFLUENZA A&B ANTIGENS
Influenza A (ARMC): NEGATIVE
Influenza B (ARMC): NEGATIVE

## 2021-05-15 LAB — GROUP A STREP BY PCR: Group A Strep by PCR: NOT DETECTED

## 2021-05-15 MED ORDER — ACETAMINOPHEN 500 MG PO TABS
1000.0000 mg | ORAL_TABLET | Freq: Once | ORAL | Status: AC
  Administered 2021-05-15: 1000 mg via ORAL

## 2021-05-15 MED ORDER — PROMETHAZINE-DM 6.25-15 MG/5ML PO SYRP: 5.0000 mL | ORAL_SOLUTION | Freq: Four times a day (QID) | ORAL | 0 refills | Status: DC | PRN

## 2021-05-15 MED ORDER — BENZONATATE 100 MG PO CAPS: 200.0000 mg | ORAL_CAPSULE | Freq: Three times a day (TID) | ORAL | 0 refills | Status: DC

## 2021-05-15 NOTE — ED Provider Notes (Signed)
[facility]    CSN: [id number] Arrival date & time: 05/15/21  1844      History   Chief Complaint Chief Complaint  Patient presents with   Fever   Generalized Body Aches   Cough    HPI Ellieanna Richerson is a 41 y.o. female.   HPI  41 year old female here for evaluation of upper respiratory complaints.  Patient reports her symptoms started this morning.  She has been experiencing a headache, body aches, fever, sore throat, and a cough.  She states that she did have some productive sputum this morning but has not had any sputum production throughout the day.  She denies any runny nose nasal congestion, ear pain, shortness of breath or wheezing.  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  History reviewed. No pertinent past medical history.  There are no problems to display for this patient.   Past Surgical History:  Procedure Laterality Date   ABDOMINAL HYSTERECTOMY      OB History   No obstetric history on file.      Home Medications    Prior to Admission medications   Medication Sig Start Date End Date Taking? Authorizing Provider  benzonatate (TESSALON) 100 MG capsule Take 2 capsules (200 mg total) by mouth every 8 (eight) hours. 05/15/21  Yes Becky Augusta, NP  FLUoxetine (PROZAC) 10 MG capsule Take by mouth. 03/08/19  Yes [provider]  promethazine-dextromethorphan (PROMETHAZINE-DM) 6.25-15 MG/5ML syrup Take 5 mLs by mouth 4 (four) times daily as needed. 05/15/21  Yes Becky Augusta, NP  doxycycline (VIBRAMYCIN) 100 MG capsule Take 1 capsule (100 mg total) by mouth 2 (two) times daily. 02/26/20   Tommie Sams, DO  LINZESS 290 MCG CAPS capsule Take 290 mcg by mouth daily. 05/06/20   [provider]  metroNIDAZOLE (FLAGYL) 500 MG tablet Take 1 tablet (500 mg total) by mouth 2 (two) times daily. 06/09/20   Mickie Bail, NP  predniSONE (DELTASONE) 50 MG tablet 1 tablet daily x 5 days 02/26/20   Tommie Sams, DO   cetirizine (ZYRTEC ALLERGY) 10 MG tablet Take 1 tablet (10 mg total) by mouth daily. 11/08/19 02/26/20  Wallis Bamberg, PA-C    Family History History reviewed. No pertinent family history.  Social History Social History   Tobacco Use   Smoking status: Never   Smokeless tobacco: Never  Vaping Use   Vaping Use: Never used  Substance Use Topics   Alcohol use: Not Currently   Drug use: Never     Allergies   Patient has no known allergies.   Review of Systems Review of Systems  Constitutional:  Positive for fever.  HENT:  Positive for sore throat. Negative for congestion, ear pain and rhinorrhea.   Respiratory:  Positive for cough.   Neurological:  Positive for headaches.  Hematological: Negative.   Psychiatric/Behavioral: Negative.      Physical Exam Triage Vital Signs ED Triage Vitals  Enc Vitals Group     BP 05/15/21 1933 121/82     Pulse Rate 05/15/21 1933 (!) 111     Resp 05/15/21 1933 18     Temp 05/15/21 1933 (!) 102.8 F (39.3 C)     Temp Source 05/15/21 1933 Oral     SpO2 05/15/21 1933 100 %     Weight --      Height --      Head Circumference --      Peak Flow --  Pain Score 05/15/21 1934 10     Pain Loc --      Pain Edu? --      Excl. in [place]? --    No data found.  Updated Vital Signs BP 121/82 (BP Location: Left Arm)   Pulse (!) 111   Temp (!) 102.8 F (39.3 C) (Oral)   Resp 18   SpO2 100%   Visual Acuity Right Eye Distance:   Left Eye Distance:   Bilateral Distance:    Right Eye Near:   Left Eye Near:    Bilateral Near:     Physical Exam Vitals and nursing note reviewed.  Constitutional:      Appearance: Normal appearance. She is ill-appearing.  HENT:     Head: Normocephalic and atraumatic.     Right Ear: Tympanic membrane, ear canal and external ear normal. There is no impacted cerumen.     Left Ear: Tympanic membrane, ear canal and external ear normal. There is no impacted cerumen.     Nose: Nose normal. No congestion or  rhinorrhea.     Mouth/Throat:     Pharynx: Oropharynx is clear. No oropharyngeal exudate or posterior oropharyngeal erythema.  Cardiovascular:     Rate and Rhythm: Normal rate and regular rhythm.     Pulses: Normal pulses.     Heart sounds: Normal heart sounds. No murmur heard.   No friction rub. No gallop.  Pulmonary:     Effort: Pulmonary effort is normal.     Breath sounds: Normal breath sounds. No wheezing, rhonchi or rales.  Musculoskeletal:     Cervical back: Normal range of motion and neck supple.  Lymphadenopathy:     Cervical: No cervical adenopathy.  Skin:    General: Skin is warm and dry.     Capillary Refill: Capillary refill takes less than 2 seconds.     Findings: No erythema or rash.  Neurological:     General: No focal deficit present.     Mental Status: She is alert and oriented to person, place, and time.  Psychiatric:        Mood and Affect: Mood normal.        Behavior: Behavior normal.        Thought Content: Thought content normal.        Judgment: Judgment normal.     UC Treatments / Results  Labs (all labs ordered are listed, but only abnormal results are displayed) Labs Reviewed  RAPID INFLUENZA A&B ANTIGENS  GROUP A STREP BY PCR  SARS CORONAVIRUS 2 (TAT 6-24 HRS)    EKG   Radiology No results found.  Procedures Procedures (including critical care time)  Medications Ordered in UC Medications  acetaminophen (TYLENOL) tablet 1,000 mg (1,000 mg Oral Given 05/15/21 1948)    Initial Impression / Assessment and Plan / UC Course  I have reviewed the triage vital signs and the nursing notes.  Pertinent labs & imaging results that were available during my care of the patient were reviewed by me and considered in my medical decision making (see chart for details).  Patient is a nontoxic, though ill-appearing 41 year old female here for evaluation of fever, headache, body aches, sore throat, and cough that all started this morning.  Her cough was  productive this morning for some green sputum but she has not had a productive cough all day long and she is not coughing in the exam room.  She denies any runny nose nasal congestion, ear pain, shortness of breath,  or wheezing.  Patient reports that from a significant complaint right now is her headache.  Physical exam reveals pearly-gray tympanic membranes bilaterally with normal light reflex and clear external auditory canals.  Nasal mucosa is pink and moist without erythema, edema, or discharge.  Oropharyngeal exam is benign.  No cervical lymphadenopathy appreciated on exam.  Cardiopulmonary exam feels clear lung sounds in all fields.  Will check rapid flu and send out COVID.  Rapid influenza is negative.  We will discharge patient home with a diagnosis of viral URI with a cough.  Tylenol and ibuprofen as needed for fever and body aches, Tessalon Perles and Promethazine DM as needed for cough and congestion.  Work note provided.   Final Clinical Impressions(s) / UC Diagnoses   Final diagnoses:  Viral URI with cough     Discharge Instructions      Isolate at home pending the results of your COVID test.  If you test positive then you will have to quarantine for 5 days from the start of your symptoms.  After 5 days you can break quarantine if your symptoms have improved and you have not had a fever for 24 hours without taking Tylenol or ibuprofen.  Use over-the-counter Tylenol and ibuprofen as needed for body aches and fever.  Use the Tessalon Perles during the day as needed for cough and the Promethazine DM cough syrup at nighttime as will make you drowsy.  If you develop any increased shortness of breath-especially at rest, you are unable to speak in full sentences, or is a late sign your lips are turning blue you need to go the ER for evaluation.      ED Prescriptions     Medication Sig Dispense Auth. Provider   benzonatate (TESSALON) 100 MG capsule Take 2 capsules (200 mg total)  by mouth every 8 (eight) hours. 21 capsule Becky Augusta, NP   promethazine-dextromethorphan (PROMETHAZINE-DM) 6.25-15 MG/5ML syrup Take 5 mLs by mouth 4 (four) times daily as needed. 118 mL Becky Augusta, NP      PDMP not reviewed this encounter.   Becky Augusta, NP 05/15/21 2007

## 2021-05-15 NOTE — ED Triage Notes (Signed)
Pt reports fever, body aches, cough and sor throat since this morning  ?

## 2021-05-15 NOTE — Discharge Instructions (Addendum)
Isolate at home pending the results of your COVID test.  If you test positive then you will have to quarantine for 5 days from the start of your symptoms.  After 5 days you can break quarantine if your symptoms have improved and you have not had a fever for 24 hours without taking Tylenol or ibuprofen.  Use over-the-counter Tylenol and ibuprofen as needed for body aches and fever.  Use the Tessalon Perles during the day as needed for cough and the Promethazine DM cough syrup at nighttime as will make you drowsy.  If you develop any increased shortness of breath-especially at rest, you are unable to speak in full sentences, or is a late sign your lips are turning blue you need to go the ER for evaluation.  

## 2021-05-16 LAB — SARS CORONAVIRUS 2 (TAT 6-24 HRS): SARS Coronavirus 2: NEGATIVE

## 2021-10-22 ENCOUNTER — Ambulatory Visit: Admit: 2021-10-22 | Discharge status: Absent without leave | Payer: BC Managed Care – PPO

## 2021-11-29 ENCOUNTER — Ambulatory Visit
Admission: EM | Admit: 2021-11-29 | Discharge: 2021-11-29 | Disposition: A | Discharge status: Home / Self Care | Payer: Medicaid Other | Attending: Physician Assistant | Admitting: Physician Assistant

## 2021-11-29 DIAGNOSIS — Z1152 Encounter for screening for COVID-19: Secondary | ICD-10-CM | POA: Insufficient documentation

## 2021-11-29 DIAGNOSIS — J069 Acute upper respiratory infection, unspecified: Secondary | ICD-10-CM | POA: Insufficient documentation

## 2021-11-29 DIAGNOSIS — J029 Acute pharyngitis, unspecified: Secondary | ICD-10-CM | POA: Diagnosis not present

## 2021-11-29 DIAGNOSIS — R52 Pain, unspecified: Secondary | ICD-10-CM | POA: Insufficient documentation

## 2021-11-29 DIAGNOSIS — R051 Acute cough: Secondary | ICD-10-CM | POA: Insufficient documentation

## 2021-11-29 LAB — GROUP A STREP BY PCR: Group A Strep by PCR: NOT DETECTED

## 2021-11-29 LAB — SARS CORONAVIRUS 2 BY RT PCR: SARS Coronavirus 2 by RT PCR: NEGATIVE

## 2021-11-29 MED ORDER — PROMETHAZINE-DM 6.25-15 MG/5ML PO SYRP: 5.0000 mL | ORAL_SOLUTION | Freq: Four times a day (QID) | ORAL | 0 refills | Status: DC | PRN

## 2021-11-29 NOTE — ED Provider Notes (Signed)
[facility]    CSN: [id number] Arrival date & time: 11/29/21  1921      History   Chief Complaint Chief Complaint  Patient presents with   Fever   Generalized Body Aches   Sore Throat    HPI Alicia Franklin is a 41 y.o. female presenting for onset of fatigue, feeling feverish, sore throat, postnasal drainage, cough, body aches yesterday.  Patient reports feeling a bit worse today.  She has been taking ibuprofen around-the-clock.  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  She reports 6 sick children recently.  Patient is denying any breathing difficulty/wheezing, abdominal pain, nausea/vomiting or diarrhea.  No other complaints.  HPI  History reviewed. No pertinent past medical history.  There are no problems to display for this patient.   Past Surgical History:  Procedure Laterality Date   ABDOMINAL HYSTERECTOMY      OB History   No obstetric history on file.      Home Medications    Prior to Admission medications   Medication Sig Start Date End Date Taking? Authorizing Provider  promethazine-dextromethorphan (PROMETHAZINE-DM) 6.25-15 MG/5ML syrup Take 5 mLs by mouth 4 (four) times daily as needed. 11/29/21  Yes Laurene Footman B, PA-C  FLUoxetine (PROZAC) 10 MG capsule Take by mouth. 03/08/19   [provider]  LINZESS 290 MCG CAPS capsule Take 290 mcg by mouth daily. 05/06/20   [provider]  cetirizine (ZYRTEC ALLERGY) 10 MG tablet Take 1 tablet (10 mg total) by mouth daily. 11/08/19 02/26/20  Jaynee Eagles, PA-C    Family History History reviewed. No pertinent family history.  Social History Social History   Tobacco Use   Smoking status: Never   Smokeless tobacco: Never  Vaping Use   Vaping Use: Never used  Substance Use Topics   Alcohol use: Not Currently   Drug use: Never     Allergies   Patient has no known allergies.   Review  of Systems Review of Systems  Constitutional:  Positive for fatigue and fever. Negative for chills and diaphoresis.  HENT:  Positive for congestion, postnasal drip and sore throat. Negative for ear pain, rhinorrhea, sinus pressure and sinus pain.   Respiratory:  Positive for cough. Negative for shortness of breath.   Cardiovascular:  Negative for chest pain.  Gastrointestinal:  Negative for abdominal pain, diarrhea, nausea and vomiting.  Musculoskeletal:  Positive for myalgias. Negative for arthralgias.  Skin:  Negative for rash.  Neurological:  Negative for weakness and headaches.  Hematological:  Negative for adenopathy.     Physical Exam Triage Vital Signs ED Triage Vitals  Enc Vitals Group     BP      Pulse      Resp      Temp      Temp src      SpO2      Weight      Height      Head Circumference      Peak Flow      Pain Score      Pain Loc      Pain Edu?      Excl. in [place]?    No data found.  Updated Vital Signs BP 134/80 (BP Location: Left Arm)   Pulse 65   Temp 98.5 F (36.9 C) (Oral)   Resp 16   Ht 5\' 8"  (1.727 m)   Wt  140 lb (63.5 kg)   SpO2 93%   BMI 21.29 kg/m   Physical Exam Vitals and nursing note reviewed.  Constitutional:      General: She is not in acute distress.    Appearance: Normal appearance. She is well-developed. She is not ill-appearing or toxic-appearing.  HENT:     Head: Normocephalic and atraumatic.     Nose: Congestion present.     Mouth/Throat:     Mouth: Mucous membranes are moist.     Pharynx: Oropharynx is clear. Posterior oropharyngeal erythema present.  Eyes:     General: No scleral icterus.       Right eye: No discharge.        Left eye: No discharge.     Conjunctiva/sclera: Conjunctivae normal.  Cardiovascular:     Rate and Rhythm: Normal rate and regular rhythm.     Heart sounds: Normal heart sounds.  Pulmonary:     Effort: Pulmonary effort is normal. No respiratory distress.     Breath sounds: Normal breath  sounds.  Musculoskeletal:     Cervical back: Neck supple.  Skin:    General: Skin is dry.  Neurological:     General: No focal deficit present.     Mental Status: She is alert. Mental status is at baseline.     Motor: No weakness.     Gait: Gait normal.  Psychiatric:        Mood and Affect: Mood normal.        Behavior: Behavior normal.        Thought Content: Thought content normal.      UC Treatments / Results  Labs (all labs ordered are listed, but only abnormal results are displayed) Labs Reviewed  GROUP A STREP BY PCR  SARS CORONAVIRUS 2 BY RT PCR    EKG   Radiology No results found.  Procedures Procedures (including critical care time)  Medications Ordered in UC Medications - No data to display  Initial Impression / Assessment and Plan / UC Course  I have reviewed the triage vital signs and the nursing notes.  Pertinent labs & imaging results that were available during my care of the patient were reviewed by me and considered in my medical decision making (see chart for details).   41 year old female presenting for fatigue, body aches, cough, congestion, and sore throat that started yesterday.  Patient is a Oncologist.  Denies recorded fever, breathing difficulty, nausea/vomiting or diarrhea.  Vitals are normal and stable.  Patient is overall well-appearing but appears tired.  On exam she has mild nasal congestion, erythema posterior pharynx and postnasal drainage.  Chest is clear auscultation heart regular rate and rhythm.  PCR strep test and COVID test obtained today.  Negative testing.  Discussed results with patient.  Advised patient symptoms consistent with viral illness.  Supportive care encouraged with increasing rest and fluids and continuing ibuprofen, decongestant such as the Promethazine DM or OTC meds such as Mucinex, DayQuil/NyQuil or Robitussin.  Advised nasal spray such as Flonase, nasal saline.  Advised that she should be feeling  better within 7 to 10 days/she is not her symptoms worsen she should return for reevaluation.    Final Clinical Impressions(s) / UC Diagnoses   Final diagnoses:  Viral upper respiratory tract infection  Sore throat  Body aches  Acute cough  Encounter for screening for COVID-19     Discharge Instructions      -Negative COVID and strep testing -You likely have another cold  virus  URI/COLD SYMPTOMS: Your exam today is consistent with a viral illness. Antibiotics are not indicated at this time. Use medications as directed, including cough syrup, nasal saline, and decongestants. Your symptoms should improve over the next few days and resolve within 7-10 days. Increase rest and fluids. F/u if symptoms worsen or predominate such as sore throat, ear pain, productive cough, shortness of breath, or if you develop high fevers or worsening fatigue over the next several days.       ED Prescriptions     Medication Sig Dispense Auth. Provider   promethazine-dextromethorphan (PROMETHAZINE-DM) 6.25-15 MG/5ML syrup Take 5 mLs by mouth 4 (four) times daily as needed. 118 mL Danton Clap, PA-C      PDMP not reviewed this encounter.   Danton Clap, PA-C 11/29/21 2012

## 2021-11-29 NOTE — ED Triage Notes (Signed)
Fever, cough, and sore throat x 2 days. Patient having body aches as well. Hass ben taking ibuprofen with slight relief.  Patient is a Oncologist.

## 2021-11-29 NOTE — Discharge Instructions (Addendum)
-  Negative COVID and strep testing -You likely have another cold virus  URI/COLD SYMPTOMS: Your exam today is consistent with a viral illness. Antibiotics are not indicated at this time. Use medications as directed, including cough syrup, nasal saline, and decongestants. Your symptoms should improve over the next few days and resolve within 7-10 days. Increase rest and fluids. F/u if symptoms worsen or predominate such as sore throat, ear pain, productive cough, shortness of breath, or if you develop high fevers or worsening fatigue over the next several days.

## 2021-12-05 ENCOUNTER — Ambulatory Visit
Admission: EM | Admit: 2021-12-05 | Discharge: 2021-12-05 | Disposition: A | Discharge status: Home / Self Care | Payer: Medicaid Other | Attending: Physician Assistant | Admitting: Physician Assistant

## 2021-12-05 DIAGNOSIS — K2100 Gastro-esophageal reflux disease with esophagitis, without bleeding: Secondary | ICD-10-CM | POA: Diagnosis not present

## 2021-12-05 MED ORDER — ALUM & MAG HYDROXIDE-SIMETH 200-200-20 MG/5ML PO SUSP
30.0000 mL | Freq: Once | ORAL | Status: AC
  Administered 2021-12-05: 30 mL via ORAL

## 2021-12-05 MED ORDER — PANTOPRAZOLE SODIUM 40 MG PO TBEC: 40.0000 mg | DELAYED_RELEASE_TABLET | Freq: Every day | ORAL | 1 refills | Status: DC

## 2021-12-05 NOTE — ED Provider Notes (Signed)
[facility]    CSN: [id number] Arrival date & time: 12/05/21  1801      History   Chief Complaint Chief Complaint  Patient presents with   Gastroesophageal Reflux    HPI Alicia Franklin is a 41 y.o. female.   Patient is a 41 year old female who presents with chief complaint of chest pressure with burning that started Sunday night, waking her from sleep.  She states it feels like food is getting stuck part way down.  Patient does report recent "head cold with drainage" last week and that symptoms could be related to that.  Patient states worsening of the burning pain with eating and that that increases the pressure.  Patient denies any nausea or abdominal pain.  Patient reports some relief from Tums that is allowed her to sleep through the night but states the pain comes back the next morning.  Patient states she took some Zantac last night and at noon today without much improvement in her symptoms.  Patient also reports some current additional stressors, including going through divorce proceedings and trying to sell her home.    History reviewed. No pertinent past medical history.  There are no problems to display for this patient.   Past Surgical History:  Procedure Laterality Date   ABDOMINAL HYSTERECTOMY      OB History   No obstetric history on file.      Home Medications    Prior to Admission medications   Medication Sig Start Date End Date Taking? Authorizing Provider  pantoprazole (PROTONIX) 40 MG tablet Take 1 tablet (40 mg total) by mouth daily. 12/05/21  Yes Luvenia Redden, PA-C  cetirizine (ZYRTEC ALLERGY) 10 MG tablet Take 1 tablet (10 mg total) by mouth daily. 11/08/19 02/26/20  Jaynee Eagles, PA-C    Family History History reviewed. No pertinent family history.  Social History Social History   Tobacco Use   Smoking status: Never   Smokeless tobacco: Never  Vaping Use   Vaping Use: Never used  Substance Use Topics   Alcohol  use: Not Currently   Drug use: Never     Allergies   Patient has no known allergies.   Review of Systems Review of Systems as noted above in HPI.  Other systems reviewed and found to be negative   Physical Exam Triage Vital Signs ED Triage Vitals  Enc Vitals Group     BP 12/05/21 1830 109/72     Pulse Rate 12/05/21 1830 74     Resp --      Temp 12/05/21 1830 98.2 F (36.8 C)     Temp Source 12/05/21 1830 Oral     SpO2 12/05/21 1830 100 %     Weight 12/05/21 1829 135 lb (61.2 kg)     Height 12/05/21 1829 5\' 8"  (1.727 m)     Head Circumference --      Peak Flow --      Pain Score 12/05/21 1828 0     Pain Loc --      Pain Edu? --      Excl. in [place]? --    No data found.  Updated Vital Signs BP 109/72 (BP Location: Left Arm)   Pulse 74   Temp 98.2 F (36.8 C) (Oral)   Ht 5\' 8"  (1.727 m)   Wt 135 lb (61.2 kg)   SpO2 100%   BMI 20.53 kg/m   Visual Acuity Right Eye Distance:   Left Eye Distance:   Bilateral  Distance:    Right Eye Near:   Left Eye Near:    Bilateral Near:     Physical Exam Constitutional:      Appearance: Normal appearance.  Cardiovascular:     Rate and Rhythm: Normal rate and regular rhythm.     Heart sounds: No murmur heard. Pulmonary:     Effort: Pulmonary effort is normal.     Breath sounds: Normal breath sounds.  Neurological:     General: No focal deficit present.     Mental Status: She is alert and oriented to person, place, and time.      UC Treatments / Results  Labs (all labs ordered are listed, but only abnormal results are displayed) Labs Reviewed - No data to display  EKG   Radiology No results found.  Procedures Procedures (including critical care time)  Medications Ordered in UC Medications  alum & mag hydroxide-simeth (MAALOX/MYLANTA) 200-200-20 MG/5ML suspension 30 mL (30 mLs Oral Given 12/05/21 1844)    Initial Impression / Assessment and Plan / UC Course  I have reviewed the triage vital signs and the  nursing notes.  Pertinent labs & imaging results that were available during my care of the patient were reviewed by me and considered in my medical decision making (see chart for details).    Patient with reported burning sensation of pressure in her chest that started Sunday night.  Burning sensation worsens with eating and that in turn makes the pressure worse.  Some improvement with Tums and her to sleep through the night.  Tried taking Zantac last night without much improvement yet.  Patient does report some current stress related to divorce proceedings and trying to sell her house.   Patient had resolution of her burning with a GI cocktail.  We will give her prescription for Protonix have her eat bland foods.  Continue the Tums as needed have her follow-up with primary care if no improvement for GI referral Final Clinical Impressions(s) / UC Diagnoses   Final diagnoses:  Gastroesophageal reflux disease with esophagitis, unspecified whether hemorrhage     Discharge Instructions      -Protonix: 1 tablet daily -May continue the Tums as needed for symptom -Bland foods.  Information attached -Would follow-up with primary care.  That way primary can continue following this and may give referral for GI should symptoms not improve and possible need for endoscopy.     ED Prescriptions     Medication Sig Dispense Auth. Provider   pantoprazole (PROTONIX) 40 MG tablet Take 1 tablet (40 mg total) by mouth daily. 30 tablet Candis Schatz, PA-C      PDMP not reviewed this encounter.   Candis Schatz, PA-C 12/05/21 1904

## 2021-12-05 NOTE — Discharge Instructions (Addendum)
-  Protonix: 1 tablet daily -May continue the Tums as needed for symptom -Bland foods.  Information attached -Would follow-up with primary care.  That way primary can continue following this and may give referral for GI should symptoms not improve and possible need for endoscopy.

## 2021-12-05 NOTE — ED Triage Notes (Signed)
Patient reports that she has been having some burning in her chest. Patient reports that Monday and Tuesday it started feeling like her food wasn't going all the way down.

## 2022-03-14 ENCOUNTER — Ambulatory Visit
Admission: EM | Admit: 2022-03-14 | Discharge: 2022-03-14 | Disposition: A | Discharge status: Home / Self Care | Payer: Medicaid Other | Attending: Emergency Medicine | Admitting: Emergency Medicine

## 2022-03-14 DIAGNOSIS — Z1152 Encounter for screening for COVID-19: Secondary | ICD-10-CM | POA: Diagnosis not present

## 2022-03-14 DIAGNOSIS — J101 Influenza due to other identified influenza virus with other respiratory manifestations: Secondary | ICD-10-CM | POA: Diagnosis present

## 2022-03-14 LAB — RESP PANEL BY RT-PCR (RSV, FLU A&B, COVID)  RVPGX2
Influenza A by PCR: NEGATIVE
Influenza B by PCR: POSITIVE — AB
Resp Syncytial Virus by PCR: NEGATIVE
SARS Coronavirus 2 by RT PCR: NEGATIVE

## 2022-03-14 MED ORDER — BENZONATATE 100 MG PO CAPS: 200.0000 mg | ORAL_CAPSULE | Freq: Three times a day (TID) | ORAL | 0 refills | Status: DC

## 2022-03-14 MED ORDER — PROMETHAZINE-DM 6.25-15 MG/5ML PO SYRP: 5.0000 mL | ORAL_SOLUTION | Freq: Four times a day (QID) | ORAL | 0 refills | Status: DC | PRN

## 2022-03-14 MED ORDER — IPRATROPIUM BROMIDE 0.06 % NA SOLN: 2.0000 | Freq: Four times a day (QID) | NASAL | 12 refills | Status: DC

## 2022-03-14 MED ORDER — ACETAMINOPHEN 325 MG PO TABS
975.0000 mg | ORAL_TABLET | Freq: Once | ORAL | Status: AC
  Administered 2022-03-14: 975 mg via ORAL

## 2022-03-14 MED ORDER — OSELTAMIVIR PHOSPHATE 75 MG PO CAPS: 75.0000 mg | ORAL_CAPSULE | Freq: Two times a day (BID) | ORAL | 0 refills | Status: DC

## 2022-03-14 NOTE — ED Provider Notes (Signed)
[facility]    CSN: [id number] Arrival date & time: 03/14/22  0800      History   Chief Complaint Chief Complaint  Patient presents with   Generalized Body Aches   Cough   Fever    HPI Alicia Franklin is a 42 y.o. female.   HPI  42 year old female here for evaluation of flulike symptoms.  She reports that she and her boyfriend were in [facility] and for the last 4 days he has had similar symptoms and her started abruptly last night.  They consist of a fever, body aches, chills, runny nose with some congestion and postnasal drip, a cough that is intermittently productive for scant amount of sputum, and nausea.  She denies any sore throat, shortness of breath, wheezing, vomiting, or diarrhea.  Her biggest complaint is her body aches.  She took 600 mg of ibuprofen approximately 90 minutes ago and states that it does not help to her body aches.  History reviewed. No pertinent past medical history.  There are no problems to display for this patient.   Past Surgical History:  Procedure Laterality Date   ABDOMINAL HYSTERECTOMY      OB History   No obstetric history on file.      Home Medications    Prior to Admission medications   Medication Sig Start Date End Date Taking? Authorizing Provider  benzonatate (TESSALON) 100 MG capsule Take 2 capsules (200 mg total) by mouth every 8 (eight) hours. 03/14/22  Yes Margarette Canada, NP  ipratropium (ATROVENT) 0.06 % nasal spray Place 2 sprays into both nostrils 4 (four) times daily. 03/14/22  Yes Margarette Canada, NP  oseltamivir (TAMIFLU) 75 MG capsule Take 1 capsule (75 mg total) by mouth every 12 (twelve) hours. 03/14/22  Yes Margarette Canada, NP  promethazine-dextromethorphan (PROMETHAZINE-DM) 6.25-15 MG/5ML syrup Take 5 mLs by mouth 4 (four) times daily as needed. 03/14/22  Yes Margarette Canada, NP  cetirizine (ZYRTEC ALLERGY) 10 MG tablet Take 1 tablet (10 mg total) by mouth daily. 11/08/19 02/26/20  Jaynee Eagles, PA-C     Family History History reviewed. No pertinent family history.  Social History Social History   Tobacco Use   Smoking status: Never   Smokeless tobacco: Never  Vaping Use   Vaping Use: Never used  Substance Use Topics   Alcohol use: Not Currently   Drug use: Never     Allergies   Patient has no known allergies.   Review of Systems Review of Systems  Constitutional:  Positive for chills and fever.  HENT:  Positive for congestion, postnasal drip and rhinorrhea. Negative for sore throat.   Respiratory:  Positive for cough. Negative for shortness of breath and wheezing.   Gastrointestinal:  Positive for nausea. Negative for diarrhea and vomiting.  Musculoskeletal:  Positive for arthralgias and myalgias.     Physical Exam Triage Vital Signs ED Triage Vitals [03/14/22 0814]  Enc Vitals Group     BP      Pulse      Resp      Temp      Temp src      SpO2      Weight 134 lb (60.8 kg)     Height 5\' 8"  (1.727 m)     Head Circumference      Peak Flow      Pain Score 10     Pain Loc      Pain Edu?      Excl. in [place]?  No data found.  Updated Vital Signs BP 115/73 (BP Location: Left Arm)   Pulse (!) 109   Temp (!) 100.9 F (38.3 C) (Oral)   Resp 16   Ht 5\' 8"  (1.727 m)   Wt 134 lb (60.8 kg)   SpO2 97%   BMI 20.37 kg/m   Visual Acuity Right Eye Distance:   Left Eye Distance:   Bilateral Distance:    Right Eye Near:   Left Eye Near:    Bilateral Near:     Physical Exam Vitals and nursing note reviewed.  Constitutional:      Appearance: She is ill-appearing.  HENT:     Head: Normocephalic and atraumatic.     Right Ear: Tympanic membrane, ear canal and external ear normal. There is no impacted cerumen.     Left Ear: Tympanic membrane, ear canal and external ear normal. There is no impacted cerumen.     Nose: Congestion and rhinorrhea present.     Comments: Nasal mucosa is erythematous and edematous with scant clear discharge in both nares.     Mouth/Throat:     Mouth: Mucous membranes are moist.     Pharynx: Oropharynx is clear. Posterior oropharyngeal erythema present. No oropharyngeal exudate.     Comments: Tonsillar pillars are unremarkable.  Posterior oropharynx has mild erythema with clear postnasal drip. Cardiovascular:     Rate and Rhythm: Regular rhythm. Tachycardia present.     Pulses: Normal pulses.     Heart sounds: Normal heart sounds. No murmur heard.    No friction rub. No gallop.  Pulmonary:     Effort: Pulmonary effort is normal.     Breath sounds: Normal breath sounds. No wheezing, rhonchi or rales.  Musculoskeletal:     Cervical back: Normal range of motion and neck supple.  Lymphadenopathy:     Cervical: No cervical adenopathy.  Skin:    General: Skin is warm and dry.     Capillary Refill: Capillary refill takes less than 2 seconds.     Findings: No rash.  Neurological:     General: No focal deficit present.     Mental Status: She is alert and oriented to person, place, and time.      UC Treatments / Results  Labs (all labs ordered are listed, but only abnormal results are displayed) Labs Reviewed  RESP PANEL BY RT-PCR (RSV, FLU A&B, COVID)  RVPGX2 - Abnormal; Notable for the following components:      Result Value   Influenza B by PCR POSITIVE (*)    All other components within normal limits    EKG   Radiology No results found.  Procedures Procedures (including critical care time)  Medications Ordered in UC Medications  acetaminophen (TYLENOL) tablet 975 mg (975 mg Oral Given 03/14/22 03/16/22)    Initial Impression / Assessment and Plan / UC Course  I have reviewed the triage vital signs and the nursing notes.  Pertinent labs & imaging results that were available during my care of the patient were reviewed by me and considered in my medical decision making (see chart for details).   Patient is a pleasant though ill-appearing 42 year old female here for evaluation of flulike symptoms  that started abruptly last night as outlined in HPI above.  The patient does look to be in moderate degree of discomfort despite taking 600 mg of ibuprofen this morning.  She remains febrile in clinic at 100.9 so I will order 975 mg of Tylenol to help with the  body aches and also for fever reduction.  Her exam reveals inflammation of her upper respiratory tract but her lungs are clear.  And her cluster of symptoms I suspect that she has other flu or COVID so I will order PCR's for both.  Respiratory panel is positive for influenza B.  I will discharge the patient home on Tamiflu 75 mg twice daily for 5 days for treatment of influenza B.  I will have her continue over-the-counter Tylenol and ibuprofen as needed for fever and bodyaches.  I will also prescribe Atrovent nasal spray, Tessalon Perles, and Promethazine DM cough syrup help with cough and congestion.  Work note provided.   Final Clinical Impressions(s) / UC Diagnoses   Final diagnoses:  Influenza B     Discharge Instructions      Your test was positive for influenza B today.  Take the Tamiflu twice daily for 5 days for treatment of influenza.  Use the Atrovent nasal spray, 2 squirts up each nostril every 6 hours, as needed for nasal congestion and runny nose.  Use over-the-counter Delsym, Zarbee's, or Robitussin during the day as needed for cough.  Use the Tessalon Perles every 8 hours as needed for cough.  Taken with a small sip of water.  You may experience some numbness to your tongue or metallic taste in her mouth, this is normal.  Use the Promethazine DM cough syrup at bedtime as will make you drowsy but it should help dry up your postnasal drip and aid you in sleep and cough relief.  Return for reevaluation, or see your primary care provider, for new or worsening symptoms.      ED Prescriptions     Medication Sig Dispense Auth. Provider   oseltamivir (TAMIFLU) 75 MG capsule Take 1 capsule (75 mg total) by mouth  every 12 (twelve) hours. 10 capsule Margarette Canada, NP   benzonatate (TESSALON) 100 MG capsule Take 2 capsules (200 mg total) by mouth every 8 (eight) hours. 21 capsule Margarette Canada, NP   ipratropium (ATROVENT) 0.06 % nasal spray Place 2 sprays into both nostrils 4 (four) times daily. 15 mL Margarette Canada, NP   promethazine-dextromethorphan (PROMETHAZINE-DM) 6.25-15 MG/5ML syrup Take 5 mLs by mouth 4 (four) times daily as needed. 118 mL Margarette Canada, NP      PDMP not reviewed this encounter.   Margarette Canada, NP 03/14/22 [phone]

## 2022-03-14 NOTE — Discharge Instructions (Signed)
Your test was positive for influenza B today.  Take the Tamiflu twice daily for 5 days for treatment of influenza.  Use the Atrovent nasal spray, 2 squirts up each nostril every 6 hours, as needed for nasal congestion and runny nose.  Use over-the-counter Delsym, Zarbee's, or Robitussin during the day as needed for cough.  Use the Tessalon Perles every 8 hours as needed for cough.  Taken with a small sip of water.  You may experience some numbness to your tongue or metallic taste in her mouth, this is normal.  Use the Promethazine DM cough syrup at bedtime as will make you drowsy but it should help dry up your postnasal drip and aid you in sleep and cough relief.  Return for reevaluation, or see your primary care provider, for new or worsening symptoms.

## 2022-03-14 NOTE — ED Triage Notes (Signed)
Pt c/o bodyaches,cough,chills & fever x1 day.

## 2022-07-16 ENCOUNTER — Ambulatory Visit (INDEPENDENT_AMBULATORY_CARE_PROVIDER_SITE_OTHER): Payer: Medicaid Other

## 2022-07-16 ENCOUNTER — Ambulatory Visit
Admission: EM | Admit: 2022-07-16 | Discharge: 2022-07-16 | Disposition: A | Discharge status: Home / Self Care | Payer: Medicaid Other | Attending: Physician Assistant | Admitting: Physician Assistant

## 2022-07-16 DIAGNOSIS — M546 Pain in thoracic spine: Secondary | ICD-10-CM

## 2022-07-16 DIAGNOSIS — R0781 Pleurodynia: Secondary | ICD-10-CM | POA: Diagnosis not present

## 2022-07-16 DIAGNOSIS — M62838 Other muscle spasm: Secondary | ICD-10-CM | POA: Diagnosis not present

## 2022-07-16 MED ORDER — NAPROXEN 500 MG PO TABS: 500.0000 mg | ORAL_TABLET | Freq: Two times a day (BID) | ORAL | 0 refills | Status: DC

## 2022-07-16 MED ORDER — BACLOFEN 10 MG PO TABS: 10.0000 mg | ORAL_TABLET | Freq: Three times a day (TID) | ORAL | 0 refills | Status: AC | PRN

## 2022-07-16 NOTE — ED Triage Notes (Signed)
Patient with c/o left sided back pain. 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

## 2022-07-16 NOTE — ED Provider Notes (Signed)
[facility]    CSN: [id number] Arrival date & time: 07/16/22  1714      History   Chief Complaint Chief Complaint  Patient presents with   Back Pain    HPI Alicia Franklin is a 42 y.o. female presenting for left rib and thoracic back pain for the past 3 days.  She reports that she tripped over a puppy and hit the left side of her back and body on a part of the stairs.  No report of head injury or loss conscious.  Has been taking ibuprofen and using heat without any real relief.  She reports that she has been back to work the past couple days and feels like symptoms of gotten worse.  Reports that pain is worse with breathing and moving especially twisting to the right.  Denies any shortness of breath.  No other complaints.  No other injuries.  HPI  History reviewed. No pertinent past medical history.  There are no problems to display for this patient.   Past Surgical History:  Procedure Laterality Date   ABDOMINAL HYSTERECTOMY      OB History   No obstetric history on file.      Home Medications    Prior to Admission medications   Medication Sig Start Date End Date Taking? Authorizing Provider  baclofen (LIORESAL) 10 MG tablet Take 1 tablet (10 mg total) by mouth 3 (three) times daily as needed for up to 7 days for muscle spasms. 07/16/22 07/23/22 Yes Eusebio Friendly B, PA-C  naproxen (NAPROSYN) 500 MG tablet Take 1 tablet (500 mg total) by mouth 2 (two) times daily. 07/16/22  Yes Shirlee Latch, PA-C  cetirizine (ZYRTEC ALLERGY) 10 MG tablet Take 1 tablet (10 mg total) by mouth daily. 11/08/19 02/26/20  Wallis Bamberg, PA-C    Family History History reviewed. No pertinent family history.  Social History Social History   Tobacco Use   Smoking status: Never   Smokeless tobacco: Never  Vaping Use   Vaping Use: Never used  Substance Use Topics   Alcohol use: Not Currently   Drug use: Never     Allergies   Patient has no known allergies.   Review  of Systems Review of Systems  Respiratory:  Negative for cough and shortness of breath.   Cardiovascular:  Negative for chest pain.  Genitourinary:  Negative for flank pain and hematuria.  Musculoskeletal:  Positive for back pain. Negative for arthralgias, gait problem and joint swelling.  Skin:  Negative for color change and wound.  Neurological:  Negative for weakness and numbness.     Physical Exam Triage Vital Signs ED Triage Vitals  Enc Vitals Group     BP      Pulse      Resp      Temp      Temp src      SpO2      Weight      Height      Head Circumference      Peak Flow      Pain Score      Pain Loc      Pain Edu?      Excl. in [place]?    No data found.  Updated Vital Signs BP 119/79 (BP Location: Right Arm)   Pulse 70   Temp 98.3 F (36.8 C) (Oral)   Resp 16   SpO2 99%      Physical Exam Vitals and nursing note reviewed.  Constitutional:      General: She is not in acute distress.    Appearance: Normal appearance. She is not ill-appearing or toxic-appearing.  HENT:     Head: Normocephalic and atraumatic.  Eyes:     General: No scleral icterus.       Right eye: No discharge.        Left eye: No discharge.     Conjunctiva/sclera: Conjunctivae normal.  Cardiovascular:     Rate and Rhythm: Normal rate and regular rhythm.     Heart sounds: Normal heart sounds.  Pulmonary:     Effort: Pulmonary effort is normal. No respiratory distress.     Breath sounds: Normal breath sounds.  Chest:     Chest wall: Tenderness (TTP along lower left lateral ribs) present.  Musculoskeletal:     Cervical back: Neck supple.     Thoracic back: Swelling, spasms and tenderness (TTP lower parathoracic muscles of the left side) present. Normal range of motion.     Lumbar back: No tenderness.  Skin:    General: Skin is dry.  Neurological:     General: No focal deficit present.     Mental Status: She is alert. Mental status is at baseline.     Motor: No weakness.     Gait:  Gait normal.  Psychiatric:        Mood and Affect: Mood normal.        Behavior: Behavior normal.        Thought Content: Thought content normal.      UC Treatments / Results  Labs (all labs ordered are listed, but only abnormal results are displayed) Labs Reviewed - No data to display  EKG   Radiology DG Ribs Unilateral W/Chest Left  Result Date: 07/16/2022 CLINICAL DATA:  Left rib pain after fall 2 days ago EXAM: LEFT RIBS AND CHEST - 3+ VIEW COMPARISON:  Chest x-ray 12/07/2006 FINDINGS: No fracture or other bone lesions are seen involving the ribs. There is no evidence of pneumothorax or pleural effusion. Both lungs are clear. Heart size and mediastinal contours are within normal limits. IMPRESSION: Negative. Electronically Signed   By: Darliss Cheney M.D.   On: 07/16/2022 18:20    Procedures Procedures (including critical care time)  Medications Ordered in UC Medications - No data to display  Initial Impression / Assessment and Plan / UC Course  I have reviewed the triage vital signs and the nursing notes.  Pertinent labs & imaging results that were available during my care of the patient were reviewed by me and considered in my medical decision making (see chart for details).   42 year old female presents for left rib and thoracic back pain after accidental fall a few days ago.  Reports increased pain with breathing and states the pain has gotten worse despite taking ibuprofen and applying heat.  Vitals are normal and stable.  Exam significant for tenderness of the lower posterior and lateral ribs and tenderness of left lower parathoracic muscles with associated stiffness of the musculature.  No spinal tenderness.  X-ray of left ribs pain.  Negative.  Discussed results of x-ray with patient.  Muscle spasms and strain.  Supportive care encouraged.  Sent naproxen and baclofen.  Reviewed RICE guidelines and use of heat and muscle rubs as well as stretches.  Return  precautions.  She declines a work note and an AVS.   Final Clinical Impressions(s) / UC Diagnoses   Final diagnoses:  Acute left-sided thoracic back pain  Muscle  spasm  Rib pain on left side     Discharge Instructions      BACK PAIN: Stressed avoiding painful activities . RICE (REST, ICE, COMPRESSION, ELEVATION) guidelines reviewed. May alternate ice and heat. Consider use of muscle rubs, Salonpas patches, etc. Use medications as directed including muscle relaxers if prescribed. Take anti-inflammatory medications as prescribed or OTC NSAIDs/Tylenol.  F/u with PCP in 7-10 days for reexamination, and please feel free to call or return to the urgent care at any time for any questions or concerns you may have and we will be happy to help you!   BACK PAIN RED FLAGS: If the back pain acutely worsens or there are any red flag symptoms such as numbness/tingling, leg weakness, saddle anesthesia, or loss of bowel/bladder control, go immediately to the ER. Follow up with [place] as scheduled or sooner if the pain does not begin to resolve or if it worsens before the follow up       ED Prescriptions     Medication Sig Dispense Auth. Provider   naproxen (NAPROSYN) 500 MG tablet Take 1 tablet (500 mg total) by mouth 2 (two) times daily. 30 tablet Eusebio Friendly B, PA-C   baclofen (LIORESAL) 10 MG tablet Take 1 tablet (10 mg total) by mouth 3 (three) times daily as needed for up to 7 days for muscle spasms. 20 each Gareth Morgan      PDMP not reviewed this encounter.   Shirlee Latch, PA-C 07/16/22 1827

## 2022-07-16 NOTE — Discharge Instructions (Signed)

## 2022-09-12 ENCOUNTER — Other Ambulatory Visit: Payer: Self-pay | Admitting: Family Medicine

## 2022-09-12 DIAGNOSIS — Z1231 Encounter for screening mammogram for malignant neoplasm of breast: Secondary | ICD-10-CM

## 2022-10-07 ENCOUNTER — Inpatient Hospital Stay: Admission: RE | Admit: 2022-10-07 | Payer: Medicaid Other | Source: Ambulatory Visit

## 2023-03-08 ENCOUNTER — Ambulatory Visit
Admission: EM | Admit: 2023-03-08 | Discharge: 2023-03-08 | Disposition: A | Discharge status: Home / Self Care | Payer: Medicaid Other | Attending: Emergency Medicine | Admitting: Emergency Medicine

## 2023-03-08 ENCOUNTER — Ambulatory Visit (INDEPENDENT_AMBULATORY_CARE_PROVIDER_SITE_OTHER): Payer: Medicaid Other

## 2023-03-08 ENCOUNTER — Encounter: Payer: Self-pay | Admitting: Emergency Medicine

## 2023-03-08 DIAGNOSIS — R051 Acute cough: Secondary | ICD-10-CM

## 2023-03-08 DIAGNOSIS — J09X2 Influenza due to identified novel influenza A virus with other respiratory manifestations: Secondary | ICD-10-CM

## 2023-03-08 LAB — RESP PANEL BY RT-PCR (FLU A&B, COVID) ARPGX2
Influenza A by PCR: POSITIVE — AB
Influenza B by PCR: NEGATIVE
SARS Coronavirus 2 by RT PCR: NEGATIVE

## 2023-03-08 LAB — GROUP A STREP BY PCR: Group A Strep by PCR: NOT DETECTED

## 2023-03-08 MED ORDER — BENZONATATE 100 MG PO CAPS: 200.0000 mg | ORAL_CAPSULE | Freq: Three times a day (TID) | ORAL | 0 refills | Status: AC

## 2023-03-08 MED ORDER — OSELTAMIVIR PHOSPHATE 75 MG PO CAPS: 75.0000 mg | ORAL_CAPSULE | Freq: Two times a day (BID) | ORAL | 0 refills | Status: AC

## 2023-03-08 MED ORDER — IPRATROPIUM BROMIDE 0.06 % NA SOLN: 2.0000 | Freq: Four times a day (QID) | NASAL | 12 refills | Status: AC

## 2023-03-08 MED ORDER — PROMETHAZINE-DM 6.25-15 MG/5ML PO SYRP: 5.0000 mL | ORAL_SOLUTION | Freq: Four times a day (QID) | ORAL | 0 refills | Status: AC | PRN

## 2023-03-08 NOTE — ED Triage Notes (Signed)
Patient c/o cough, chest congestion, bodyaches and fever that started yesterday.

## 2023-03-08 NOTE — ED Provider Notes (Signed)
 [facility]    CSN: [id number] Arrival date & time: 03/08/23  1025      History   Chief Complaint Chief Complaint  Patient presents with   Generalized Body Aches   Fever    HPI Alicia Franklin is a 43 y.o. female.   HPI  43 year old female with no significant past medical history presents for evaluation of respiratory symptoms that began last night.  She reports that her pain started with a sore throat but quickly progressed to a productive cough and a burning sensation in her chest.  She endorses runny nose, nasal congestion, fever with a Tmax of 102.4, cough is productive for yellow sputum, and wheezing.  She denies any shortness of breath, ear pain, vomiting, or diarrhea.  The patient does teach kindergarten and her partner's nephew was also recently diagnosed with strep, flu, and RSV.  History reviewed. No pertinent past medical history.  There are no active problems to display for this patient.   Past Surgical History:  Procedure Laterality Date   ABDOMINAL HYSTERECTOMY      OB History   No obstetric history on file.      Home Medications    Prior to Admission medications   Medication Sig Start Date End Date Taking? Authorizing Provider  benzonatate  (TESSALON ) 100 MG capsule Take 2 capsules (200 mg total) by mouth every 8 (eight) hours. 03/08/23  Yes Bernardino Ditch, NP  ipratropium (ATROVENT ) 0.06 % nasal spray Place 2 sprays into both nostrils 4 (four) times daily. 03/08/23  Yes Bernardino Ditch, NP  oseltamivir  (TAMIFLU ) 75 MG capsule Take 1 capsule (75 mg total) by mouth every 12 (twelve) hours. 03/08/23  Yes Bernardino Ditch, NP  promethazine -dextromethorphan (PROMETHAZINE -DM) 6.25-15 MG/5ML syrup Take 5 mLs by mouth 4 (four) times daily as needed. 03/08/23  Yes Bernardino Ditch, NP  cetirizine  (ZYRTEC  ALLERGY) 10 MG tablet Take 1 tablet (10 mg total) by mouth daily. 11/08/19 02/26/20  Christopher Savannah, PA-C    Family History History reviewed. No pertinent family  history.  Social History Social History   Tobacco Use   Smoking status: Never   Smokeless tobacco: Never  Vaping Use   Vaping status: Never Used  Substance Use Topics   Alcohol use: Not Currently   Drug use: Never     Allergies   Patient has no known allergies.   Review of Systems Review of Systems  Constitutional:  Positive for fever.  HENT:  Positive for congestion, rhinorrhea and sore throat. Negative for ear pain.   Respiratory:  Positive for cough and wheezing. Negative for shortness of breath.   Gastrointestinal:  Negative for diarrhea, nausea and vomiting.  Musculoskeletal:  Positive for arthralgias and myalgias.  Skin:  Negative for rash.     Physical Exam Triage Vital Signs ED Triage Vitals  Encounter Vitals Group     BP      Systolic BP Percentile      Diastolic BP Percentile      Pulse      Resp      Temp      Temp src      SpO2      Weight      Height      Head Circumference      Peak Flow      Pain Score      Pain Loc      Pain Education      Exclude from Growth Chart    No data found.  Updated Vital Signs BP 110/62 (BP Location: Right Arm)   Pulse (!) 113   Temp 99.6 F (37.6 C) (Oral)   Resp 14   Ht 5' 8 (1.727 m)   Wt 134 lb 0.6 oz (60.8 kg)   SpO2 96%   BMI 20.38 kg/m   Visual Acuity Right Eye Distance:   Left Eye Distance:   Bilateral Distance:    Right Eye Near:   Left Eye Near:    Bilateral Near:     Physical Exam Vitals and nursing note reviewed.  Constitutional:      Appearance: She is ill-appearing.  HENT:     Head: Normocephalic and atraumatic.     Right Ear: Tympanic membrane, ear canal and external ear normal. There is no impacted cerumen.     Left Ear: Tympanic membrane, ear canal and external ear normal. There is no impacted cerumen.     Nose: Congestion and rhinorrhea present.     Comments: Nasal mucosa is erythematous and edematous with scant clear discharge in both nares.    Mouth/Throat:     Mouth:  Mucous membranes are moist.     Pharynx: Oropharynx is clear. Posterior oropharyngeal erythema present. No oropharyngeal exudate.     Comments: Soft palate tonsillar pillars are erythematous and edematous but free of exudate. Neck:     Comments: Bilateral anterior, nontender, cervical lymphadenopathy present. Cardiovascular:     Rate and Rhythm: Normal rate and regular rhythm.     Pulses: Normal pulses.     Heart sounds: Normal heart sounds. No murmur heard.    No friction rub. No gallop.  Pulmonary:     Effort: Pulmonary effort is normal.     Breath sounds: Normal breath sounds. No wheezing, rhonchi or rales.  Musculoskeletal:     Cervical back: Normal range of motion and neck supple. No tenderness.  Lymphadenopathy:     Cervical: Cervical adenopathy present.  Skin:    General: Skin is warm and dry.     Capillary Refill: Capillary refill takes less than 2 seconds.     Findings: No erythema or rash.  Neurological:     General: No focal deficit present.     Mental Status: She is alert and oriented to person, place, and time.      UC Treatments / Results  Labs (all labs ordered are listed, but only abnormal results are displayed) Labs Reviewed  RESP PANEL BY RT-PCR (FLU A&B, COVID) ARPGX2 - Abnormal; Notable for the following components:      Result Value   Influenza A by PCR POSITIVE (*)    All other components within normal limits  GROUP A STREP BY PCR    EKG   Radiology DG Chest 2 View Result Date: 03/08/2023 CLINICAL DATA:  Productive cough and fever. EXAM: CHEST - 2 VIEW COMPARISON:  Chest x-ray 07/16/2022 FINDINGS: The heart and mediastinal contours are within normal limits. No focal consolidation. No pulmonary edema. No pleural effusion. No pneumothorax. No acute osseous abnormality. IMPRESSION: No active cardiopulmonary disease. Electronically Signed   By: Morgane  Naveau M.D.   On: 03/08/2023 10:59    Procedures Procedures (including critical care  time)  Medications Ordered in UC Medications - No data to display  Initial Impression / Assessment and Plan / UC Course  I have reviewed the triage vital signs and the nursing notes.  Pertinent labs & imaging results that were available during my care of the patient were reviewed by me and considered in  my medical decision making (see chart for details).   Patient is a pleasant, though ill-appearing, 43 year old female presenting for evaluation of flulike symptoms that began yesterday.  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  Her symptoms began with a significantly sore throat which then progressed to a productive cough.  She does have inflammation of her upper respiratory tract on exam as well as edema and erythema of bilateral tonsillar pillars.  Cardiopulmonary exam reveals clear lung sounds in all fields.  Given her symptoms and physical presentation I will order a COVID and influenza PCR, strep PCR, and chest x-ray to evaluate for any acute cardiopulmonary process.  Chest x-ray independently reviewed and evaluated by me.  Impression: Lung fields are well aerated without evidence of infiltrate or effusion.  Cardiomediastinal silhouette appears normal.  Radiology overread is pending. Radiology impression states no active cardiopulmonary disease.  Strep PCR is negative.  Respiratory panel is positive for influenza A.  I will discharge patient home with a diagnosis of influenza A on Tamiflu  75 mg twice daily for 5 days.  Tylenol  and/or ibuprofen as needed for fever and bodyaches.  Atrovent  nasal spray for nasal congestion and Tessalon  Perles and Promethazine  DM cough syrup for cough and congestion.  Return precautions reviewed.   Final Clinical Impressions(s) / UC Diagnoses   Final diagnoses:  Acute cough  Influenza due to identified novel influenza A virus with other respiratory manifestations     Discharge  Instructions      Take the Tamiflu  twice daily for 5 days for treatment of influenza.  Use the Atrovent  nasal spray, 2 squirts up each nostril every 6 hours, as needed for nasal congestion and runny nose.  Use over-the-counter Tylenol  and/or ibuprofen according the package instructions as needed for fever and pain.  Use the Tessalon  Perles every 8 hours as needed for cough.  Taken with a small sip of water.  You may experience some numbness to your tongue or metallic taste in your mouth, this is normal.  Use the Promethazine  DM cough syrup at bedtime as will make you drowsy but it should help dry up your postnasal drip and aid you in sleep and cough relief.  Return for reevaluation, or see your primary care provider, for new or worsening symptoms.      ED Prescriptions     Medication Sig Dispense Auth. Provider   benzonatate  (TESSALON ) 100 MG capsule Take 2 capsules (200 mg total) by mouth every 8 (eight) hours. 21 capsule Bernardino Ditch, NP   ipratropium (ATROVENT ) 0.06 % nasal spray Place 2 sprays into both nostrils 4 (four) times daily. 15 mL Bernardino Ditch, NP   promethazine -dextromethorphan (PROMETHAZINE -DM) 6.25-15 MG/5ML syrup Take 5 mLs by mouth 4 (four) times daily as needed. 118 mL Bernardino Ditch, NP   oseltamivir  (TAMIFLU ) 75 MG capsule Take 1 capsule (75 mg total) by mouth every 12 (twelve) hours. 10 capsule Bernardino Ditch, NP      PDMP not reviewed this encounter.   Bernardino Ditch, NP 03/08/23 1120

## 2023-03-08 NOTE — Discharge Instructions (Addendum)
 Take the Tamiflu  twice daily for 5 days for treatment of influenza.  Use the Atrovent  nasal spray, 2 squirts up each nostril every 6 hours, as needed for nasal congestion and runny nose.  Use over-the-counter Tylenol  and/or ibuprofen according the package instructions as needed for fever and pain.  Use the Tessalon  Perles every 8 hours as needed for cough.  Taken with a small sip of water.  You may experience some numbness to your tongue or metallic taste in your mouth, this is normal.  Use the Promethazine  DM cough syrup at bedtime as will make you drowsy but it should help dry up your postnasal drip and aid you in sleep and cough relief.  Return for reevaluation, or see your primary care provider, for new or worsening symptoms.
